# Patient Record
Sex: Female | Born: 1985 | Race: White | Hispanic: No | Marital: Married | Smoking: Never smoker
Health system: Southern US, Community
[De-identification: ages and names within clinical notes are randomized; demographics above are authoritative.]

## PROBLEM LIST (undated history)

## (undated) DIAGNOSIS — R42 Dizziness and giddiness: Secondary | ICD-10-CM

## (undated) HISTORY — PX: FOOT SURGERY: SHX648

---

## 2008-03-03 ENCOUNTER — Ambulatory Visit: Payer: Self-pay | Admitting: Family Medicine

## 2015-07-23 DIAGNOSIS — E669 Obesity, unspecified: Secondary | ICD-10-CM | POA: Insufficient documentation

## 2015-07-25 DIAGNOSIS — N83201 Unspecified ovarian cyst, right side: Secondary | ICD-10-CM | POA: Insufficient documentation

## 2017-05-29 ENCOUNTER — Encounter: Payer: Self-pay | Admitting: *Deleted

## 2017-05-29 ENCOUNTER — Ambulatory Visit
Admission: EM | Admit: 2017-05-29 | Discharge: 2017-05-29 | Disposition: A | Discharge status: Home / Self Care | Payer: 59 | Attending: Family Medicine | Admitting: Family Medicine

## 2017-05-29 DIAGNOSIS — N3001 Acute cystitis with hematuria: Secondary | ICD-10-CM

## 2017-05-29 LAB — URINALYSIS, COMPLETE (UACMP) WITH MICROSCOPIC
Bilirubin Urine: NEGATIVE
GLUCOSE, UA: NEGATIVE mg/dL
KETONES UR: NEGATIVE mg/dL
Nitrite: NEGATIVE
PROTEIN: 100 mg/dL — AB
SQUAMOUS EPITHELIAL / LPF: NONE SEEN
Specific Gravity, Urine: 1.01 (ref 1.005–1.030)
pH: 7 (ref 5.0–8.0)

## 2017-05-29 MED ORDER — AMOXICILLIN-POT CLAVULANATE 500-125 MG PO TABS: 1.0000 | ORAL_TABLET | Freq: Two times a day (BID) | ORAL | 0 refills | Status: AC

## 2017-05-29 NOTE — ED Provider Notes (Signed)
[facility]    CSN: [id number] Arrival date & time: 05/29/17  0810     History   Chief Complaint Chief Complaint  Patient presents with  . Urinary Frequency    HPI Carrie Benjamin is a 31 y.o. female.   CC: urinary frequency x since last night, unchanged.   Suprapubic pain when urinating. 'feels like prior UTIs.'  NO dysuria, hematuria, fever, flank pain.  However notes blood in pads since birth of daughter. Not increased since symptom started.   No recent UTI.  No concern for stds.    Breastfeeding; two week old daughter      History reviewed. No pertinent past medical history.  There are no active problems to display for this patient.   Past Surgical History:  Procedure Laterality Date  . FOOT SURGERY      OB History    No data available       Home Medications    Prior to Admission medications   Medication Sig Start Date End Date Taking? Authorizing Provider  Prenatal Vit-Fe Fumarate-FA (PRENATAL VITAMIN PO) Take by mouth daily.   Yes [provider]  amoxicillin-clavulanate (AUGMENTIN) 500-125 MG tablet Take 1 tablet (500 mg total) by mouth 2 (two) times daily. 05/29/17 06/03/17  Allegra GranaArnett, Jalea Bronaugh G, FNP    Family History History reviewed. No pertinent family history.  Social History Social History  Substance Use Topics  . Smoking status: Never Smoker  . Smokeless tobacco: Never Used  . Alcohol use Yes     Allergies   Sulfa antibiotics   Review of Systems Review of Systems  Constitutional: Negative for chills and fever.  Respiratory: Negative for cough.   Cardiovascular: Negative for chest pain and palpitations.  Gastrointestinal: Negative for nausea and vomiting.  Genitourinary: Positive for vaginal bleeding. Negative for dysuria, flank pain, hematuria, pelvic pain and vaginal discharge.     Physical Exam Triage Vital Signs ED Triage Vitals  Enc Vitals Group     BP 05/29/17 0830 131/85   Pulse Rate 05/29/17 0830 84     Resp 05/29/17 0830 16     Temp 05/29/17 0830 98.2 F (36.8 C)     Temp Source 05/29/17 0830 Oral     SpO2 05/29/17 0830 100 %     Weight 05/29/17 0831 232 lb (105.2 kg)     Height --      Head Circumference --      Peak Flow --      Pain Score 05/29/17 0832 2     Pain Loc --      Pain Edu? --      Excl. in GC? --    No data found.   Updated Vital Signs BP 131/85 (BP Location: Left Arm)   Pulse 84   Temp 98.2 F (36.8 C) (Oral)   Resp 16   Wt 232 lb (105.2 kg)   SpO2 100%   Visual Acuity Right Eye Distance:   Left Eye Distance:   Bilateral Distance:    Right Eye Near:   Left Eye Near:    Bilateral Near:     Physical Exam  Constitutional: She appears well-developed and well-nourished.  Cardiovascular: Normal rate, regular rhythm, normal heart sounds and normal pulses.   Pulmonary/Chest: Effort normal and breath sounds normal. She has no wheezes. She has no rhonchi. She has no rales.  Abdominal: There is no CVA tenderness.  Neurological: She is alert.  Skin: Skin is warm and dry.  Psychiatric: She has a normal mood and affect. Her speech is normal and behavior is normal. Thought content normal.  Vitals reviewed.    UC Treatments / Results  Labs (all labs ordered are listed, but only abnormal results are displayed) Labs Reviewed  URINALYSIS, COMPLETE (UACMP) WITH MICROSCOPIC - Abnormal; Notable for the following:       Result Value   APPearance HAZY (*)    Hgb urine dipstick LARGE (*)    Protein, ur 100 (*)    Leukocytes, UA MODERATE (*)    Bacteria, UA FEW (*)    All other components within normal limits  URINE CULTURE    EKG  EKG Interpretation None       Radiology No results found.  Procedures Procedures (including critical care time)  Medications Ordered in UC Medications - No data to display   Initial Impression / Assessment and Plan / UC Course  I have reviewed the triage vital signs and the nursing  notes.  Pertinent labs & imaging results that were available during my care of the patient were reviewed by me and considered in my medical decision making (see chart for details).       Final Clinical Impressions(s) / UC Diagnoses   Final diagnoses:  Acute cystitis with hematuria  Urine dip positive leukocytes, blood, few bacteria. Blood may be vaginal since patient is very recent postpartum. Patient is afebrile. She is currently breast-feeding;  we discussed starting augmetin  which is safe in breast-feeding. Pending urine culture. Return precautions given.  New Prescriptions Discharge Medication List as of 05/29/2017  8:47 AM    START taking these medications   Details  amoxicillin-clavulanate (AUGMENTIN) 500-125 MG tablet Take 1 tablet (500 mg total) by mouth 2 (two) times daily., Starting Sat 05/29/2017, Until Thu 06/03/2017, Normal         Controlled Substance Prescriptions [place] Controlled Substance Registry consulted? Not Applicable   Allegra Grana, [place] 05/29/17 [phone]

## 2017-05-29 NOTE — Discharge Instructions (Signed)
Plenty of fluids.  Will call with urine culture  Congrats on your daughter!

## 2017-05-29 NOTE — ED Triage Notes (Signed)
Patient started having urinary frequency and pain during urination last PM.

## 2017-06-01 LAB — URINE CULTURE

## 2017-09-02 ENCOUNTER — Ambulatory Visit
Admission: EM | Admit: 2017-09-02 | Discharge: 2017-09-02 | Disposition: A | Discharge status: Home / Self Care | Payer: 59 | Attending: Emergency Medicine | Admitting: Emergency Medicine

## 2017-09-02 ENCOUNTER — Other Ambulatory Visit: Payer: Self-pay

## 2017-09-02 DIAGNOSIS — Z3202 Encounter for pregnancy test, result negative: Secondary | ICD-10-CM

## 2017-09-02 DIAGNOSIS — R51 Headache: Secondary | ICD-10-CM

## 2017-09-02 DIAGNOSIS — J029 Acute pharyngitis, unspecified: Secondary | ICD-10-CM

## 2017-09-02 DIAGNOSIS — R509 Fever, unspecified: Secondary | ICD-10-CM | POA: Diagnosis not present

## 2017-09-02 LAB — RAPID STREP SCREEN (MED CTR MEBANE ONLY): Streptococcus, Group A Screen (Direct): NEGATIVE

## 2017-09-02 LAB — PREGNANCY, URINE: Preg Test, Ur: NEGATIVE

## 2017-09-02 MED ORDER — IBUPROFEN 600 MG PO TABS: 600.0000 mg | ORAL_TABLET | Freq: Four times a day (QID) | ORAL | 0 refills | Status: DC | PRN

## 2017-09-02 MED ORDER — PENICILLIN G BENZATHINE 1200000 UNIT/2ML IM SUSP
1.2000 10*6.[IU] | Freq: Once | INTRAMUSCULAR | Status: AC
  Administered 2017-09-02: 1.2 10*6.[IU] via INTRAMUSCULAR

## 2017-09-02 NOTE — Discharge Instructions (Signed)
your rapid strep was negative today, so we have sent off a throat culture.  We will contact you and call in the appropriate antibiotics if your culture comes back positive .  you were treated today for presumed strep throat so you do not need any further antibiotics.  Give us a working phone number.  1 gram of Tylenol and 600 mg ibuprofen together 3-4 times a day as needed for pain.  Make sure you drink plenty of extra fluids.  Some people find salt water gargles and  Traditional Medicinal's "Throat Coat" tea helpful. Take 5 mL of liquid Benadryl and 5 mL of Maalox. Mix it together, and then hold it in your mouth for as long as you can and then swallow. You may do this 4 times a day.  return here or follow-up with your doctor in 5-7 days for consideration of mono if you are not better.  Go to www.goodrx.com to look up your medications. This will give you a list of where you can find your prescriptions at the most affordable prices. Or ask the pharmacist what the cash price is, or if they have any other discount programs available to help make your medication more affordable. This can be less expensive than what you would pay with insurance.

## 2017-09-02 NOTE — ED Triage Notes (Signed)
Patient complains of sudden sore throat that started yesterday. Patient states that she started running a fever last night. Patient states that she is currently breastfeeding.

## 2017-09-02 NOTE — ED Provider Notes (Signed)
HPI  SUBJECTIVE:  Patient reports sore throat starting abruptly yesterday. Sx worse with swallowing.  Sx better with Tylenol + Fever tmax 104 tympanically + Swollen neck glands    No Cough/URI sxs + Myalgias + Headache No Rash     No Recent Strep, mono, flu exposure No Abdominal Pain No reflux sxs No Allergy sxs  No Breathing difficulty, voice changes, sensation of throat swelling shut No Drooling No Trismus No abx in past month.  Patient did get a flu shot this year + antipyretic in past 4-6 hrs-Tylenol Past medical history of recurrent strep in childhood, mono in college.  No history of diabetes, hypertension.  LMP: One year ago.  She is not sure if she could be pregnant.  She is currently breast-feeding. PMD: [facility] in [place].  History reviewed. No pertinent past medical history.  Past Surgical History:  Procedure Laterality Date  . FOOT SURGERY      History reviewed. No pertinent family history.  Social History   Tobacco Use  . Smoking status: Never Smoker  . Smokeless tobacco: Never Used  Substance Use Topics  . Alcohol use: Yes    Frequency: Never    Comment: occasionally  . Drug use: No    No current facility-administered medications for this encounter.   Current Outpatient Medications:  .  Prenatal Vit-Fe Fumarate-FA (PRENATAL VITAMIN PO), Take by mouth daily., Disp: , Rfl:  .  ibuprofen (ADVIL,MOTRIN) 600 MG tablet, Take 1 tablet (600 mg total) by mouth every 6 (six) hours as needed., Disp: 30 tablet, Rfl: 0  Allergies  Allergen Reactions  . Sulfa Antibiotics Rash     ROS  As noted in HPI.   Physical Exam  BP 118/87 (BP Location: Left Arm)   Pulse (!) 132   Temp 99.5 F (37.5 C) (Oral) Comment: tylenol taken at 315pm  Resp 17   Ht 5\' 8"  (1.727 m)   Wt 230 lb (104.3 kg)   SpO2 97%   BMI 34.97 kg/m   Constitutional: Well developed, well nourished, no acute distress Eyes:  EOMI, conjunctiva normal bilaterally HENT:  Normocephalic, atraumatic,mucus membranes moist.  Is normal.- nasal congestion +erythematous oropharynx + enlarged tonsils + exudates. Uvula midline.  Respiratory: Normal inspiratory effort Cardiovascular: , no murmurs, rubs, gallops GI: nondistended, Regular tachycardia, nontender. No appreciable splenomegaly skin: No rash, skin intact Lymph: + cervical LN  Musculoskeletal: no deformities Neurologic: Alert & oriented x 3, no focal neuro deficits Psychiatric: Speech and behavior appropriate.  ED Course   Medications  penicillin g benzathine (BICILLIN LA) 1200000 UNIT/2ML injection 1.2 Million Units (1.2 Million Units Intramuscular Given 09/02/17 1733)    Orders Placed This Encounter  Procedures  . Rapid strep screen    Standing Status:   Standing    Number of Occurrences:   1    Order Specific Question:   Patient immune status    Answer:   Normal  . Culture, group A strep    Standing Status:   Standing    Number of Occurrences:   1  . Pregnancy, urine    Standing Status:   Standing    Number of Occurrences:   1  . Droplet precaution    Standing Status:   Standing    Number of Occurrences:   1    Results for orders placed or performed during the hospital encounter of 09/02/17 (from the past 24 hour(s))  Rapid strep screen     Status: None   Collection  Time: 09/02/17  4:44 PM  Result Value Ref Range   Streptococcus, Group A Screen (Direct) NEGATIVE NEGATIVE  Pregnancy, urine     Status: None   Collection Time: 09/02/17  5:20 PM  Result Value Ref Range   Preg Test, Ur NEGATIVE NEGATIVE   No results found.  ED Clinical Impression  Exudative pharyngitis   ED Assessment/Plan .  Rapid strep negative.  Centor score is 4/4, so we will treat empirically for strep throat with Bicillin 1,200,000 units IM x1.  Home with continued pushing fluids, 600 mg of ibuprofen with 1 g of Tylenol, Benadryl/Maalox mixture. obtaining throat cx.   She will return here in 5-7 days for  consideration of mono if she is not better. . Patient to followup with PMD when necessary.  Urine pregnancy negative.    Discussed labs,  MDM, plan and followup with patient. Discussed sn/sx that should prompt return to the ED. patient agrees with plan.   Meds ordered this encounter  Medications  . penicillin g benzathine (BICILLIN LA) 1200000 UNIT/2ML injection 1.2 Million Units    Order Specific Question:   Antibiotic Indication:    Answer:   Pharyngitis  . ibuprofen (ADVIL,MOTRIN) 600 MG tablet    Sig: Take 1 tablet (600 mg total) by mouth every 6 (six) hours as needed.    Dispense:  30 tablet    Refill:  0     *This clinic note was created using Scientist, clinical (histocompatibility and immunogenetics)Dragon dictation software. Therefore, there may be occasional mistakes despite careful proofreading.    Domenick GongMortenson, Arturo Freundlich, MD 09/02/17 1758

## 2017-09-03 LAB — CULTURE, GROUP A STREP (THRC)

## 2018-03-08 ENCOUNTER — Ambulatory Visit
Admission: EM | Admit: 2018-03-08 | Discharge: 2018-03-08 | Disposition: A | Discharge status: Home / Self Care | Payer: 59 | Attending: Family Medicine | Admitting: Family Medicine

## 2018-03-08 ENCOUNTER — Other Ambulatory Visit: Payer: Self-pay

## 2018-03-08 DIAGNOSIS — R05 Cough: Secondary | ICD-10-CM

## 2018-03-08 DIAGNOSIS — R059 Cough, unspecified: Secondary | ICD-10-CM

## 2018-03-08 DIAGNOSIS — J0100 Acute maxillary sinusitis, unspecified: Secondary | ICD-10-CM | POA: Diagnosis not present

## 2018-03-08 MED ORDER — AMOXICILLIN-POT CLAVULANATE 875-125 MG PO TABS: 1.0000 | ORAL_TABLET | Freq: Two times a day (BID) | ORAL | 0 refills | Status: DC

## 2018-03-08 NOTE — ED Provider Notes (Signed)
[facility] ____________________________________________  Time seen: Approximately 10:21 AM  I have reviewed the triage vital signs and the nursing notes.   HISTORY  Chief Complaint Cough   HPI Carrie Benjamin is a 32 y.o. female presented for evaluation of runny nose, nasal congestion, sinus pressure and cough present for the last 3 weeks.  States initially her kids got sick but her husband and then she did.  States initially she was thought was a virus that was at around its course.  States around 2 weeks she thought that she might have been improving but worsened over the last week with increased sinus pain and pressure sensation.  Denies fevers.  States some chest congestion but denies any chest pain or shortness of breath.  Has continued to eat and drink well.  States only has occasionally taken some Tylenol.  She is currently breast-feeding her healthy 4460-month-old daughter so she was unsure what she could take over-the-counter.  Denies other aggravating or alleviating factors.  Reports otherwise feels well.  No sore throat currently.  Denies recent antibiotic use.  Denies chest pain or shortness of breath, extremity swelling, rash.  Denies concerns of current pregnancy.   History reviewed. No pertinent past medical history.  There are no active problems to display for this patient.   Past Surgical History:  Procedure Laterality Date  . FOOT SURGERY       No current facility-administered medications for this encounter.   Current Outpatient Medications:  .  Prenatal Vit-Fe Fumarate-FA (PRENATAL VITAMIN PO), Take by mouth daily., Disp: , Rfl:  .  amoxicillin-clavulanate (AUGMENTIN) 875-125 MG tablet, Take 1 tablet by mouth every 12 (twelve) hours., Disp: 20 tablet, Rfl: 0  Allergies Sulfa antibiotics  History reviewed. No pertinent family history.  Social History Social History   Tobacco Use  . Smoking status: Never Smoker  . Smokeless tobacco:  Never Used  Substance Use Topics  . Alcohol use: Yes    Frequency: Never    Comment: occasionally  . Drug use: No    Review of Systems Constitutional: No fever ENT: No sore throat. Cardiovascular: Denies chest pain. Respiratory: Denies shortness of breath. Gastrointestinal: No abdominal pain.  Musculoskeletal: Negative for back pain. Skin: Negative for rash. ____________________________________________   PHYSICAL EXAM:  VITAL SIGNS: ED Triage Vitals  Enc Vitals Group     BP 03/08/18 0942 (!) 140/99     Pulse Rate 03/08/18 0942 98     Resp 03/08/18 0942 18     Temp 03/08/18 0942 98 F (36.7 C)     Temp Source 03/08/18 0942 Oral     SpO2 03/08/18 0942 98 %     Weight 03/08/18 0941 220 lb (99.8 kg)     Height 03/08/18 0941 5\' 8"  (1.727 m)     Head Circumference --      Peak Flow --      Pain Score 03/08/18 0940 3     Pain Loc --      Pain Edu? --      Excl. in GC? --     Constitutional: Alert and oriented. Well appearing and in no acute distress. Eyes: Conjunctivae are normal.  Head: Atraumatic.Mild to moderate tenderness to palpation bilateral frontal and maxillary sinuses. No swelling. No erythema.   Ears: no erythema, normal TMs bilaterally.   Nose: nasal congestion with bilateral nasal turbinate erythema and edema.   Mouth/Throat: Mucous membranes are moist.  Oropharynx non-erythematous.No tonsillar swelling or exudate.  Neck: No  stridor.  No cervical spine tenderness to palpation. Hematological/Lymphatic/Immunilogical: No cervical lymphadenopathy. Cardiovascular: Normal rate, regular rhythm. Grossly normal heart sounds.  Good peripheral circulation. Respiratory: Normal respiratory effort.  No retractions. No wheezes, rales or rhonchi. Good air movement.  Musculoskeletal: No lower extremity tenderness nor edema.  No cervical, thoracic or lumbar tenderness to palpation.  Neurologic:  Normal speech and language. No gross focal neurologic deficits are appreciated. No  gait instability. Skin:  Skin is warm, dry and intact. No rash noted. Psychiatric: Mood and affect are normal. Speech and behavior are normal.  ___________________________________________   LABS (all labs ordered are listed, but only abnormal results are displayed)  Labs Reviewed - No data to display ____________________________________________   PROCEDURES Procedures    INITIAL IMPRESSION / ASSESSMENT AND PLAN / ED COURSE  Pertinent labs & imaging results that were available during my care of the patient were reviewed by me and considered in my medical decision making (see chart for details).  Well-appearing patient.  No acute distress.  Suspect sinusitis.  Will treat with oral Augmentin.  Currently nursing what she describes as a healthy 25-month-old without medical problems. Counseled regarding nursing with medications, discussed also over-the-counter guaifenesin and Claritin as needed.  Encourage rest, fluids, saline rinses and supportive care.Discussed indication, risks and benefits of medications with patient.  Discussed follow up with Primary care physician this week. Discussed follow up and return parameters including no resolution or any worsening concerns. Patient verbalized understanding and agreed to plan.   ____________________________________________   FINAL CLINICAL IMPRESSION(S) / ED DIAGNOSES  Final diagnoses:  Acute maxillary sinusitis, recurrence not specified  Cough     ED Discharge Orders        Ordered    amoxicillin-clavulanate (AUGMENTIN) 875-125 MG tablet  Every 12 hours     03/08/18 0959       Note: This dictation was prepared with Dragon dictation along with smaller phrase technology. Any transcriptional errors that result from this process are unintentional.         Renford Dills, NP 03/08/18 2032

## 2018-03-08 NOTE — ED Triage Notes (Signed)
Patient complains of cough, congestion, sinus pain and pressure x 3 weeks.

## 2018-03-08 NOTE — Discharge Instructions (Addendum)
Take medication as prescribed. Rest. Drink plenty of fluids.  ° °Follow up with your primary care physician this week as needed. Return to Urgent care for new or worsening concerns.  ° °

## 2018-03-11 ENCOUNTER — Ambulatory Visit
Admission: EM | Admit: 2018-03-11 | Discharge: 2018-03-11 | Disposition: A | Discharge status: Home / Self Care | Payer: 59 | Attending: Family Medicine | Admitting: Family Medicine

## 2018-03-11 ENCOUNTER — Encounter: Payer: Self-pay | Admitting: Emergency Medicine

## 2018-03-11 ENCOUNTER — Other Ambulatory Visit: Payer: Self-pay

## 2018-03-11 DIAGNOSIS — J320 Chronic maxillary sinusitis: Secondary | ICD-10-CM

## 2018-03-11 MED ORDER — HYDROCODONE-ACETAMINOPHEN 5-325 MG PO TABS: 1.0000 | ORAL_TABLET | Freq: Four times a day (QID) | ORAL | 0 refills | Status: DC | PRN

## 2018-03-11 MED ORDER — DOXYCYCLINE HYCLATE 100 MG PO CAPS: 100.0000 mg | ORAL_CAPSULE | Freq: Two times a day (BID) | ORAL | 0 refills | Status: DC

## 2018-03-11 MED ORDER — FLUTICASONE PROPIONATE 50 MCG/ACT NA SUSP: 2.0000 | Freq: Every day | NASAL | 0 refills | Status: DC

## 2018-03-11 NOTE — Discharge Instructions (Addendum)
If You are not improving follow up with ear nose and throat on Monday.  In the meantime ,if you are having more pain that is not improving, go to the emergency room.  Contact your pediatrician for approval of the use of doxycycline prior to taking it.

## 2018-03-11 NOTE — ED Triage Notes (Signed)
Patient c/o sinus pain and pressure x 3 weeks. Was seen on 07/30 for same symptoms and given Augmentin with no relief.

## 2018-03-11 NOTE — ED Provider Notes (Addendum)
[facility]    CSN: [id number] Arrival date & time: 03/11/18  1153     History   Chief Complaint No chief complaint on file.   HPI Carrie Benjamin is a 32 y.o. female.   HPI  32 year old female presents with right side facial pain and pressure that she has had for 3 weeks.  She was seen in this clinic on 03/08/2018 for the same symptoms.  She was given Augmentin; despite the medication has not gained any relief.  The reason for today's visit is that she has had extreme pain on the right side of her face states is constant.  She has brief periods where the pain  is  worse.  It affects only the right side of her face.  Not describe the pain as electrical or burning.  Nothing seems to make it worse.  She states that last night it was feeling better but today is progressively worsening as the day but is prolonged.  Is afebrile.  She has not been running a fever.  O2 sats are 96% on room air blood pressure is 149/105.  Breast-feeding a 5564-month-old child       History reviewed. No pertinent past medical history.  There are no active problems to display for this patient.   Past Surgical History:  Procedure Laterality Date  . FOOT SURGERY      OB History   None      Home Medications    Prior to Admission medications   Medication Sig Start Date End Date Taking? Authorizing Provider  Prenatal Vit-Fe Fumarate-FA (PRENATAL VITAMIN PO) Take by mouth daily.   Yes [provider]  doxycycline (VIBRAMYCIN) 100 MG capsule Take 1 capsule (100 mg total) by mouth 2 (two) times daily. 03/11/18   Lutricia Feiloemer, William P, PA-C  fluticasone (FLONASE) 50 MCG/ACT nasal spray Place 2 sprays into both nostrils daily. 03/11/18   Lutricia Feiloemer, William P, PA-C  HYDROcodone-acetaminophen (NORCO/VICODIN) 5-325 MG tablet Take 1-2 tablets by mouth every 6 (six) hours as needed. 03/11/18   Lutricia Feiloemer, William P, PA-C    Family History Family History  Problem Relation Age of Onset  . Healthy  Mother   . Healthy Father     Social History Social History   Tobacco Use  . Smoking status: Never Smoker  . Smokeless tobacco: Never Used  Substance Use Topics  . Alcohol use: Yes    Frequency: Never    Comment: occasionally  . Drug use: No     Allergies   Sulfa antibiotics   Review of Systems Review of Systems  Constitutional: Positive for activity change. Negative for appetite change, chills, fatigue and fever.  HENT: Positive for congestion, sinus pressure and sinus pain.   Respiratory: Negative for cough.   All other systems reviewed and are negative.    Physical Exam Triage Vital Signs ED Triage Vitals  Enc Vitals Group     BP 03/11/18 1212 (!) 149/105     Pulse Rate 03/11/18 1212 85     Resp 03/11/18 1212 16     Temp 03/11/18 1212 97.9 F (36.6 C)     Temp Source 03/11/18 1212 Oral     SpO2 03/11/18 1212 96 %     Weight 03/11/18 1208 220 lb (99.8 kg)     Height 03/11/18 1208 5\' 8"  (1.727 m)     Head Circumference --      Peak Flow --      Pain Score 03/11/18 1208  7     Pain Loc --      Pain Edu? --      Excl. in GC? --    No data found.  Updated Vital Signs BP (!) 149/105 (BP Location: Right Arm)   Pulse 85   Temp 97.9 F (36.6 C) (Oral)   Resp 16   Ht 5\' 8"  (1.727 m)   Wt 220 lb (99.8 kg)   SpO2 96%   BMI 33.45 kg/m   Visual Acuity Right Eye Distance:   Left Eye Distance:   Bilateral Distance:    Right Eye Near:   Left Eye Near:    Bilateral Near:     Physical Exam  Constitutional: She is oriented to person, place, and time. She appears well-developed and well-nourished. No distress.  HENT:  Head: Normocephalic.  Right Ear: External ear normal.  Left Ear: External ear normal.  Nose: Nose normal.  Mouth/Throat: Oropharynx is clear and moist. No oropharyngeal exudate.  She has tenderness to percussion on the right side of her maxillary sinus more than left.  Eyes: Pupils are equal, round, and reactive to light. Right eye  exhibits no discharge. Left eye exhibits no discharge.  Neck: Normal range of motion. Neck supple.  Pulmonary/Chest: Effort normal and breath sounds normal.  Musculoskeletal: Normal range of motion.  Lymphadenopathy:    She has no cervical adenopathy.  Neurological: She is alert and oriented to person, place, and time. She displays normal reflexes. No cranial nerve deficit or sensory deficit. She exhibits normal muscle tone. Coordination normal.  Skin: Skin is warm and dry. She is not diaphoretic.  Psychiatric: She has a normal mood and affect. Her behavior is normal. Judgment and thought content normal.  Nursing note and vitals reviewed.    UC Treatments / Results  Labs (all labs ordered are listed, but only abnormal results are displayed) Labs Reviewed - No data to display  EKG None  Radiology No results found.  Procedures Procedures (including critical care time)  Medications Ordered in UC Medications - No data to display  Initial Impression / Assessment and Plan / UC Course  I have reviewed the triage vital signs and the nursing notes.  Pertinent labs & imaging results that were available during my care of the patient were reviewed by me and considered in my medical decision making (see chart for details).     Plan: 1. Test/x-ray results and diagnosis reviewed with patient 2. rx as per orders; risks, benefits, potential side effects reviewed with patient 3. Recommend supportive treatment with Flonase nasal spray 2 sprays once daily for next 2 to 3 weeks.  I will switch her antibiotic from Augmentin to  doxycycline for short course to see if it is a failure of the antibiotic to cover her sinusitis.  Told Her that is very unusual for her to have the amount of pain that she has been, already being on the Augmentin and I am concerned about this.  Told her that if she is not improving after 2 doses of the doxycycline and the pain persists or worsen she should go to the  emergency room.  Also recommended that on Monday she should be seen by an ear nose and throat specialist if she is not improving.  She is afebrile but this may be because of the antipyretic that she is taking and also on Augmentin at the present time.  According to [employer] site,doxycycline is okay to use during breast-feeding with a very  short course.  We will also prescribe Vicodin because the amount of pain that she is having. 4.  To the emergency room if symptoms worsen or don't improve  Final Clinical Impressions(s) / UC Diagnoses   Final diagnoses:  Chronic maxillary sinusitis     Discharge Instructions      If You are not improving follow up with ear nose and throat on Monday.  In the meantime ,if you are having more pain that is not improving, go to the emergency room.  Contact your pediatrician for approval of the use of doxycycline prior to taking it.   ED Prescriptions    Medication Sig Dispense Auth. Provider   HYDROcodone-acetaminophen (NORCO/VICODIN) 5-325 MG tablet Take 1-2 tablets by mouth every 6 (six) hours as needed. 10 tablet Ovid Curd P, PA-C   doxycycline (VIBRAMYCIN) 100 MG capsule Take 1 capsule (100 mg total) by mouth 2 (two) times daily. 20 capsule Ovid Curd P, PA-C   fluticasone (FLONASE) 50 MCG/ACT nasal spray Place 2 sprays into both nostrils daily. 16 g Lutricia Feil, PA-C     Controlled Substance Prescriptions [place] Controlled Substance Registry consulted?  No records of use of narcotics listed      Joesph July 03/11/18 2026     At my request nurses contacted the patient this evening at 8:29 PM.  States that she was feeling much better and had much less pain.  Contacted the pediatrician who agreed that doxycycline was okay to take for the sinusitis   Lutricia Feil, PA-C 03/11/18 2030

## 2019-02-10 ENCOUNTER — Other Ambulatory Visit: Payer: Self-pay

## 2019-02-10 ENCOUNTER — Telehealth: Payer: Self-pay | Admitting: Family Medicine

## 2019-02-10 ENCOUNTER — Ambulatory Visit
Admission: EM | Admit: 2019-02-10 | Discharge: 2019-02-10 | Disposition: A | Discharge status: Home / Self Care | Payer: 59 | Attending: Family Medicine | Admitting: Family Medicine

## 2019-02-10 ENCOUNTER — Encounter: Payer: Self-pay | Admitting: Emergency Medicine

## 2019-02-10 DIAGNOSIS — Z3202 Encounter for pregnancy test, result negative: Secondary | ICD-10-CM

## 2019-02-10 DIAGNOSIS — R102 Pelvic and perineal pain: Secondary | ICD-10-CM | POA: Diagnosis not present

## 2019-02-10 DIAGNOSIS — R1032 Left lower quadrant pain: Secondary | ICD-10-CM | POA: Diagnosis not present

## 2019-02-10 LAB — URINALYSIS, COMPLETE (UACMP) WITH MICROSCOPIC
Bilirubin Urine: NEGATIVE
Glucose, UA: NEGATIVE mg/dL
Hgb urine dipstick: NEGATIVE
Ketones, ur: NEGATIVE mg/dL
Leukocytes,Ua: NEGATIVE
Nitrite: NEGATIVE
Protein, ur: NEGATIVE mg/dL
RBC / HPF: NONE SEEN RBC/hpf (ref 0–5)
Specific Gravity, Urine: 1.02 (ref 1.005–1.030)
pH: 7 (ref 5.0–8.0)

## 2019-02-10 LAB — PREGNANCY, URINE: Preg Test, Ur: NEGATIVE

## 2019-02-10 NOTE — ED Provider Notes (Signed)
[facility]    CSN: [id number] Arrival date & time: 02/10/19  1026     History   Chief Complaint Chief Complaint  Patient presents with  . Abdominal Pain    LLQ    HPI Carrie Wolfgang PhoenixJean Kimrey Benjamin is a 33 y.o. female.   [age] yo female with a c/o left lower abdominal/pelvic pains intermittently for the past 3 days. Denies any fevers, chills, nausea, vomiting, diarrhea, constipation, flank pain, hematuria, vaginal discharge, pregnancy. States she's due to have her menstrual period next week. Has not taken medications for this.   States about 2 years ago she had a colonoscopy and it was negative for diverticulosis.      History reviewed. No pertinent past medical history.  There are no active problems to display for this patient.   Past Surgical History:  Procedure Laterality Date  . FOOT SURGERY      OB History   No obstetric history on file.      Home Medications    Prior to Admission medications   Medication Sig Start Date End Date Taking? Authorizing Provider  doxycycline (VIBRAMYCIN) 100 MG capsule Take 1 capsule (100 mg total) by mouth 2 (two) times daily. 03/11/18   Lutricia Feiloemer, William P, PA-C  fluticasone (FLONASE) 50 MCG/ACT nasal spray Place 2 sprays into both nostrils daily. 03/11/18   Lutricia Feiloemer, William P, PA-C  HYDROcodone-acetaminophen (NORCO/VICODIN) 5-325 MG tablet Take 1-2 tablets by mouth every 6 (six) hours as needed. 03/11/18   Lutricia Feiloemer, William P, PA-C  Prenatal Vit-Fe Fumarate-FA (PRENATAL VITAMIN PO) Take by mouth daily.    [provider]    Family History Family History  Problem Relation Age of Onset  . Healthy Mother   . Healthy Father     Social History Social History   Tobacco Use  . Smoking status: Never Smoker  . Smokeless tobacco: Never Used  Substance Use Topics  . Alcohol use: Yes    Frequency: Never    Comment: occasionally  . Drug use: No     Allergies   Sulfa antibiotics   Review of Systems Review of  Systems   Physical Exam Triage Vital Signs ED Triage Vitals  Enc Vitals Group     BP 02/10/19 1041 106/68     Pulse Rate 02/10/19 1041 91     Resp 02/10/19 1041 16     Temp 02/10/19 1041 98.2 F (36.8 C)     Temp Source 02/10/19 1041 Oral     SpO2 02/10/19 1041 98 %     Weight 02/10/19 1038 240 lb (108.9 kg)     Height 02/10/19 1038 5\' 8"  (1.727 m)     Head Circumference --      Peak Flow --      Pain Score 02/10/19 1037 2     Pain Loc --      Pain Edu? --      Excl. in GC? --    No data found.  Updated Vital Signs BP 106/68 (BP Location: Left Arm)   Pulse 91   Temp 98.2 F (36.8 C) (Oral)   Resp 16   Ht 5\' 8"  (1.727 m)   Wt 108.9 kg   LMP 01/16/2019 (Approximate)   SpO2 98%   BMI 36.49 kg/m   Visual Acuity Right Eye Distance:   Left Eye Distance:   Bilateral Distance:    Right Eye Near:   Left Eye Near:    Bilateral Near:     Physical  Exam Vitals signs and nursing note reviewed.  Constitutional:      General: She is not in acute distress.    Appearance: She is not toxic-appearing or diaphoretic.  Pulmonary:     Effort: Pulmonary effort is normal.  Abdominal:     General: Bowel sounds are normal. There is no distension.     Palpations: Abdomen is soft. There is no mass.     Tenderness: There is abdominal tenderness (left lower quadrant (left pelvis area); no rebound or guarding). There is no right CVA tenderness, left CVA tenderness, guarding or rebound.     Hernia: No hernia is present.  Neurological:     Mental Status: She is alert.      UC Treatments / Results  Labs (all labs ordered are listed, but only abnormal results are displayed) Labs Reviewed  URINALYSIS, COMPLETE (UACMP) WITH MICROSCOPIC - Abnormal; Notable for the following components:      Result Value   Bacteria, UA FEW (*)    All other components within normal limits  PREGNANCY, URINE    EKG   Radiology No results found.  Procedures Procedures (including critical care  time)  Medications Ordered in UC Medications - No data to display  Initial Impression / Assessment and Plan / UC Course  I have reviewed the triage vital signs and the nursing notes.  Pertinent labs & imaging results that were available during my care of the patient were reviewed by me and considered in my medical decision making (see chart for details).      Final Clinical Impressions(s) / UC Diagnoses   Final diagnoses:  Abdominal pain, left lower quadrant  Pelvic pain     Discharge Instructions     Ibuprofen 600mg  three times daily Increase water intake Follow up as needed if symptoms worsen    ED Prescriptions    None      1. Lab results and diagnosis reviewed with patient 2. Recommend supportive treatment as above 3. Follow-up prn if symptoms worsen or don't improve   Controlled Substance Prescriptions [place] Controlled Substance Registry consulted? Not Applicable   Norval Gable, MD 02/10/19 [phone]

## 2019-02-10 NOTE — ED Triage Notes (Signed)
Patient c/o LLQ abdominal pain off and on since Tuesday.  Patient states that she has her yearly appointment with PCP next week.   Patient reports pain in her abdomen when she urinates.  Patient denies fevers.  Patient denies N/V/D.

## 2019-02-10 NOTE — Discharge Instructions (Signed)
Ibuprofen 600mg  three times daily Increase water intake Follow up as needed if symptoms worsen

## 2019-02-12 LAB — URINE CULTURE

## 2019-02-14 NOTE — Telephone Encounter (Signed)
Opened encounter by mistake.

## 2019-05-23 ENCOUNTER — Encounter: Payer: Self-pay | Admitting: Emergency Medicine

## 2019-05-23 ENCOUNTER — Ambulatory Visit
Admission: EM | Admit: 2019-05-23 | Discharge: 2019-05-23 | Disposition: A | Discharge status: Home / Self Care | Payer: 59 | Attending: Family Medicine | Admitting: Family Medicine

## 2019-05-23 ENCOUNTER — Other Ambulatory Visit: Payer: Self-pay

## 2019-05-23 DIAGNOSIS — N390 Urinary tract infection, site not specified: Secondary | ICD-10-CM

## 2019-05-23 LAB — URINALYSIS, COMPLETE (UACMP) WITH MICROSCOPIC
Bacteria, UA: NONE SEEN
Bilirubin Urine: NEGATIVE
Glucose, UA: NEGATIVE mg/dL
Ketones, ur: NEGATIVE mg/dL
Nitrite: NEGATIVE
Protein, ur: 100 mg/dL — AB
RBC / HPF: >50 RBC/hpf (ref 0–5)
Specific Gravity, Urine: 1.02 (ref 1.005–1.030)
pH: 7.5 (ref 5.0–8.0)

## 2019-05-23 MED ORDER — NITROFURANTOIN MONOHYD MACRO 100 MG PO CAPS: 100.0000 mg | ORAL_CAPSULE | Freq: Two times a day (BID) | ORAL | 0 refills | Status: DC

## 2019-05-23 NOTE — ED Triage Notes (Signed)
Pt c/o urinary frequency, lower abdominal pain, and hematuria. Started yesterday but worse this morning.

## 2019-05-23 NOTE — ED Provider Notes (Signed)
[place] [place]   Name: Kemara Quigley DOB: 1986-05-22 MRN: [id number] CSN: [id number] PCP: [facility]  Arrival date and time:  05/23/19 0865  Chief Complaint:  Urinary Frequency and Abdominal Pain   NOTE: Prior to seeing the patient today, I have reviewed the triage nursing documentation and vital signs. Clinical staff has updated patient's PMH/PSHx, current medication list, and drug allergies/intolerances to ensure comprehensive history available to assist in medical decision making.   History:   HPI: Carrie Benjamin is a 33 y.o. female who presents today with complaints of urinary symptoms that began with acute onset yesterday. She complains of dysuria, frequency, and urgency. She has appreciated gross hematuria. Patient denies her urine being malodorous. Patient denies any associated nausea, vomiting, fever, and chills. She has not experienced any pain in her lower back or flank area, but has has pressure/fullness in the suprapubic area of her abdomen. Patient advises that she does not have a past medical history that is significant for recurrent urinary tract infections. She denies any vaginal pain, bleeding, or discharge. Patient's last menstrual period was 05/09/2019. There are no concerns that she is currently pregnant.   History reviewed. No pertinent past medical history.  Past Surgical History:  Procedure Laterality Date  . FOOT SURGERY      Family History  Problem Relation Age of Onset  . Healthy Mother   . Healthy Father     Social History   Tobacco Use  . Smoking status: Never Smoker  . Smokeless tobacco: Never Used  Substance Use Topics  . Alcohol use: Yes    Frequency: Never    Comment: occasionally  . Drug use: No    There are no active problems to display for this patient.   Home Medications:    Current Meds  Medication Sig  . DULoxetine (CYMBALTA) 20 MG capsule Take by mouth.    Allergies:   Sulfa  antibiotics  Review of Systems (ROS): Review of Systems  Constitutional: Negative for chills and fever.  Respiratory: Negative for cough and shortness of breath.   Cardiovascular: Negative for chest pain and palpitations.  Gastrointestinal: Positive for abdominal pain (suprapubic). Negative for nausea and vomiting.  Genitourinary: Positive for dysuria, frequency, hematuria and urgency. Negative for decreased urine volume, pelvic pain, vaginal bleeding, vaginal discharge and vaginal pain.  Musculoskeletal: Negative for back pain.  Skin: Negative for color change, pallor and rash.  Neurological: Negative for dizziness, syncope, weakness and headaches.  All other systems reviewed and are negative.    Vital Signs: Today's Vitals   05/23/19 0856 05/23/19 0857 05/23/19 0859  BP:   (!) 131/100  Pulse:   90  Resp:   18  Temp:   98.2 F (36.8 C)  TempSrc:   Oral  SpO2:   96%  Weight:  235 lb (106.6 kg)   Height:  5\' 8"  (1.727 m)   PainSc: 4       Physical Exam: Physical Exam  Constitutional: She is oriented to person, place, and time and well-developed, well-nourished, and in no distress. No distress.  HENT:  Head: Normocephalic and atraumatic.  Eyes: Pupils are equal, round, and reactive to light. EOM are normal.  Neck: Normal range of motion. Neck supple.  Cardiovascular: Normal rate.  Pulmonary/Chest: Effort normal. No respiratory distress. She has no decreased breath sounds. She has no rhonchi.  Abdominal: Soft. Normal appearance and bowel sounds are normal. She exhibits no distension. There is no abdominal tenderness.  There is no CVA tenderness.  Musculoskeletal: Normal range of motion.  Neurological: She is alert and oriented to person, place, and time. Gait normal.  Skin: Skin is warm and dry. No rash noted. She is not diaphoretic.  Psychiatric: Mood, memory, affect and judgment normal.  Nursing note and vitals reviewed.   Urgent Care Treatments / Results:   LABS:  PLEASE NOTE: all labs that were ordered this encounter are listed, however only abnormal results are displayed. Labs Reviewed  URINALYSIS, COMPLETE (UACMP) WITH MICROSCOPIC - Abnormal; Notable for the following components:      Result Value   Color, Urine PINK (*)    APPearance HAZY (*)    Hgb urine dipstick LARGE (*)    Protein, ur 100 (*)    Leukocytes,Ua MODERATE (*)    All other components within normal limits  URINE CULTURE    EKG: -None  RADIOLOGY: No results found.  PROCEDURES: Procedures  MEDICATIONS RECEIVED THIS VISIT: Medications - No data to display  PERTINENT CLINICAL COURSE NOTES/UPDATES:   Initial Impression / Assessment and Plan / Urgent Care Course:  Pertinent labs & imaging results that were available during my care of the patient were personally reviewed by me and considered in my medical decision making (see lab/imaging section of note for values and interpretations).  Luticia Lyndsie Wallman is a 33 y.o. female who presents to [facility] today with complaints of Urinary Frequency and Abdominal Pain   Patient is well appearing overall in clinic today. She does not appear to be in any acute distress. Presenting symptoms (see HPI) and exam as documented above. UA was (+) for infection; reflex culture sent. Will treat with a 5 day course of nitrofurantoin. Patient encouraged to complete the entire course of antibiotics even if she begins to feel better. She was advised that if culture demonstrates resistance to the prescribed antibiotic, she will be contacted and advised of the need to change the antibiotic being used to treat her infection. Patient encouraged to increase her fluid intake as much as possible. Discussed that water is always best to flush the urinary tract. She was advised to avoid caffeine containing fluids until her infections clears, as caffeine can cause her to experience painful bladder spasms. May use Tylenol and/or Ibuprofen as  needed for pain/fever. May also use over the counter phenazopyridine to help relieve her current urinary pain.   Discussed follow up with primary care physician in 1 week for re-evaluation. I have reviewed the follow up and strict return precautions for any new or worsening symptoms. Patient is aware of symptoms that would be deemed urgent/emergent, and would thus require further evaluation either here or in the emergency department. At the time of discharge, she verbalized understanding and consent with the discharge plan as it was reviewed with her. All questions were fielded by provider and/or clinic staff prior to patient discharge.    Final Clinical Impressions / Urgent Care Diagnoses:   Final diagnoses:  Urinary tract infection without hematuria, site unspecified    New Prescriptions:  [place] Controlled Substance Registry consulted? Not Applicable  Meds ordered this encounter  Medications  . nitrofurantoin, macrocrystal-monohydrate, (MACROBID) 100 MG capsule    Sig: Take 1 capsule (100 mg total) by mouth 2 (two) times daily.    Dispense:  10 capsule    Refill:  0    Recommended Follow up Care:  Patient encouraged to follow up with the following provider within the specified time frame, or sooner  as dictated by the severity of her symptoms. As always, she was instructed that for any urgent/emergent care needs, she should seek care either here or in the emergency department for more immediate evaluation.  Follow-up Information    [place] In 1 week.   Why: General reassessment of symptoms if not improving Contact information: [address] [place] [address] [place] [id number] [phone]         NOTE: This note was prepared using Dragon dictation software along with smaller phrase technology. Despite my best ability to proofread, there is the potential that transcriptional errors may still occur from this process, and are completely unintentional.    Verlee MonteGray, Staisha Winiarski E, NP  05/23/19 [phone]

## 2019-05-23 NOTE — Discharge Instructions (Signed)
It was very nice seeing you today in clinic. Thank you for entrusting me with your care.   As discussed, your urine is POSITIVE for infection. Will approach treatment as follows:  Prescription has been sent to your pharmacy for antibiotics.  Please pick up and take as directed. FINISH the entire course of medication even if you are feeling better.  A culture will be sent on your provided sample. If it comes back resistant to what I have prescribed you, someone will call you and let you know that we will need to change antibiotics. Increase fluid intake as much as possible to flush your urinary tract.  Water is always the best.  Avoid caffeine until your infection clears up, as it can contribute to painful bladder spasms.  May use Tylenol and/or Ibuprofen as needed for pain/fever. May use Azo products (over the counter) to help with pain/discomfort.   Make arrangements to follow up with your regular doctor in 1 week for re-evaluation. If your symptoms/condition worsens, please seek follow up care either here or in the ER. Please remember, our [facility] providers are "right here with you" when you need us.   Again, it was my pleasure to take care of you today. Thank you for choosing our clinic. I hope that you start to feel better quickly.   Emon Lance, MSN, APRN, FNP-C, CEN Advanced Practice Provider  [facility] 

## 2019-05-24 LAB — URINE CULTURE

## 2019-08-22 ENCOUNTER — Encounter: Payer: Self-pay | Admitting: Emergency Medicine

## 2019-08-22 ENCOUNTER — Ambulatory Visit
Admission: EM | Admit: 2019-08-22 | Discharge: 2019-08-22 | Disposition: A | Discharge status: Home / Self Care | Payer: 59 | Attending: Family Medicine | Admitting: Family Medicine

## 2019-08-22 ENCOUNTER — Other Ambulatory Visit: Payer: Self-pay

## 2019-08-22 DIAGNOSIS — H8110 Benign paroxysmal vertigo, unspecified ear: Secondary | ICD-10-CM | POA: Diagnosis not present

## 2019-08-22 HISTORY — DX: Dizziness and giddiness: R42

## 2019-08-22 NOTE — ED Provider Notes (Signed)
[facility]    CSN: [id number] Arrival date & time: 08/22/19  1554      History   Chief Complaint Chief Complaint  Patient presents with  . Dizziness    HPI Carrie Benjamin is a 34 y.o. female.   34 yo female with a c/o dizziness since this morning. States sensation is "room spinning" similar to previous vertigo episodes in the past. Denies any fevers, chills, vision changes, numbness/tingling, unilateral weakness.      Past Medical History:  Diagnosis Date  . Vertigo     There are no problems to display for this patient.   Past Surgical History:  Procedure Laterality Date  . FOOT SURGERY      OB History   No obstetric history on file.      Home Medications    Prior to Admission medications   Medication Sig Start Date End Date Taking? Authorizing Provider  DULoxetine (CYMBALTA) 20 MG capsule Take by mouth. 03/17/19 03/16/20 Yes [provider]  nitrofurantoin, macrocrystal-monohydrate, (MACROBID) 100 MG capsule Take 1 capsule (100 mg total) by mouth 2 (two) times daily. 05/23/19   Karen Kitchens, NP  fluticasone (FLONASE) 50 MCG/ACT nasal spray Place 2 sprays into both nostrils daily. 03/11/18 05/23/19  Lorin Picket, PA-C    Family History Family History  Problem Relation Age of Onset  . Healthy Mother   . Healthy Father     Social History Social History   Tobacco Use  . Smoking status: Never Smoker  . Smokeless tobacco: Never Used  Substance Use Topics  . Alcohol use: Yes    Comment: occasionally  . Drug use: No     Allergies   Sulfa antibiotics   Review of Systems Review of Systems   Physical Exam Triage Vital Signs ED Triage Vitals  Enc Vitals Group     BP 08/22/19 1627 (!) 157/115     Pulse Rate 08/22/19 1627 93     Resp 08/22/19 1627 18     Temp 08/22/19 1627 98.2 F (36.8 C)     Temp Source 08/22/19 1627 Oral     SpO2 08/22/19 1627 100 %     Weight 08/22/19 1623 230 lb (104.3 kg)     Height  08/22/19 1623 5\' 8"  (1.727 m)     Head Circumference --      Peak Flow --      Pain Score 08/22/19 1623 0     Pain Loc --      Pain Edu? --      Excl. in [place]? --    No data found.  Updated Vital Signs BP (!) 157/115 (BP Location: Left Arm)   Pulse 93   Temp 98.2 F (36.8 C) (Oral)   Resp 18   Ht 5\' 8"  (1.727 m)   Wt 104.3 kg   LMP 08/01/2019 (Approximate)   SpO2 100%   BMI 34.97 kg/m   Visual Acuity Right Eye Distance:   Left Eye Distance:   Bilateral Distance:    Right Eye Near:   Left Eye Near:    Bilateral Near:     Physical Exam Vitals and nursing note reviewed.  Constitutional:      General: She is not in acute distress.    Appearance: She is not toxic-appearing or diaphoretic.  Cardiovascular:     Rate and Rhythm: Normal rate.  Pulmonary:     Effort: Pulmonary effort is normal. No respiratory distress.  Neurological:  General: No focal deficit present.     Mental Status: She is alert and oriented to person, place, and time.     Comments: Positive Hallpike maneuver      UC Treatments / Results  Labs (all labs ordered are listed, but only abnormal results are displayed) Labs Reviewed - No data to display  EKG   Radiology No results found.  Procedures Procedures (including critical care time)  Medications Ordered in UC Medications - No data to display  Initial Impression / Assessment and Plan / UC Course  I have reviewed the triage vital signs and the nursing notes.  Pertinent labs & imaging results that were available during my care of the patient were reviewed by me and considered in my medical decision making (see chart for details).      Final Clinical Impressions(s) / UC Diagnoses   Final diagnoses:  Benign paroxysmal positional vertigo, unspecified laterality     Discharge Instructions     Over the counter Dramamine or meclizine    ED Prescriptions    None      1.diagnosis reviewed with patient 2. Recommend  supportive treatment as above and vestibular exercises (modified Epley's) 3. Follow-up prn if symptoms worsen or don't improve   PDMP not reviewed this encounter.   Payton Mccallum, MD 08/22/19 2029

## 2019-08-22 NOTE — Discharge Instructions (Signed)
Over the counter Dramamine or meclizine

## 2019-08-22 NOTE — ED Triage Notes (Addendum)
Pt c/o dizziness. Started this morning about 10 am. She states that she has had vertigo in the past and this feel similar. She states is she turns her head or looks up, the room started spinning.

## 2019-09-27 ENCOUNTER — Other Ambulatory Visit: Payer: Self-pay

## 2019-09-27 ENCOUNTER — Ambulatory Visit
Admission: EM | Admit: 2019-09-27 | Discharge: 2019-09-27 | Disposition: A | Discharge status: Home / Self Care | Payer: 59 | Attending: Family Medicine | Admitting: Family Medicine

## 2019-09-27 DIAGNOSIS — Z20822 Contact with and (suspected) exposure to covid-19: Secondary | ICD-10-CM | POA: Insufficient documentation

## 2019-09-27 DIAGNOSIS — Z79899 Other long term (current) drug therapy: Secondary | ICD-10-CM | POA: Insufficient documentation

## 2019-09-27 DIAGNOSIS — J019 Acute sinusitis, unspecified: Secondary | ICD-10-CM

## 2019-09-27 DIAGNOSIS — Z882 Allergy status to sulfonamides status: Secondary | ICD-10-CM | POA: Insufficient documentation

## 2019-09-27 LAB — SARS CORONAVIRUS 2 AG (30 MIN TAT): SARS Coronavirus 2 Ag: NEGATIVE

## 2019-09-27 MED ORDER — DOXYCYCLINE HYCLATE 100 MG PO CAPS: 100.0000 mg | ORAL_CAPSULE | Freq: Two times a day (BID) | ORAL | 0 refills | Status: DC

## 2019-09-27 MED ORDER — FLUTICASONE PROPIONATE 50 MCG/ACT NA SUSP: 1.0000 | Freq: Every day | NASAL | 2 refills | Status: DC

## 2019-09-27 NOTE — ED Provider Notes (Signed)
[facility]    CSN: [id number] Arrival date & time: 09/27/19  0844      History   Chief Complaint Chief Complaint  Patient presents with  . Sinus Problem    HPI Carrie Benjamin is a 34 y.o. female.   Carrie Benjamin presents with complaints of sinus symptoms and ear pain. Woke yesterday with congestion, "head stuffy" and runny nose. Last night developed more sinus pain, woke up due to pain. History of sinus infections in the past which felt like a "cold" with "pressure."  Feels similar to a sinus infection she has had in the past which had gotten very bad for her. Left side of her face, left teeth are painful, behind left eye, left ear aches, left nose congestion. No headache, no sore throat. No fevers. No cough. No chest pain  No shortness of breath . No sore throat. No gi symptoms. No known ill contacts. Took ibuprofen last night which did provide some relief.    ROS per HPI, negative if not otherwise mentioned.      Past Medical History:  Diagnosis Date  . Vertigo     There are no problems to display for this patient.   Past Surgical History:  Procedure Laterality Date  . FOOT SURGERY      OB History   No obstetric history on file.      Home Medications    Prior to Admission medications   Medication Sig Start Date End Date Taking? Authorizing Provider  DULoxetine (CYMBALTA) 20 MG capsule Take by mouth. 03/17/19 03/16/20 Yes [provider]  doxycycline (VIBRAMYCIN) 100 MG capsule Take 1 capsule (100 mg total) by mouth 2 (two) times daily. 09/30/19   Georgetta Haber, NP  fluticasone (FLONASE) 50 MCG/ACT nasal spray Place 1 spray into both nostrils daily. 09/27/19   Georgetta Haber, NP    Family History Family History  Problem Relation Age of Onset  . Healthy Mother   . Healthy Father     Social History Social History   Tobacco Use  . Smoking status: Never Smoker  . Smokeless tobacco: Never Used  Substance  Use Topics  . Alcohol use: Yes    Comment: occasionally  . Drug use: No     Allergies   Sulfa antibiotics   Review of Systems Review of Systems   Physical Exam Triage Vital Signs ED Triage Vitals  Enc Vitals Group     BP 09/27/19 0855 (!) 138/106     Pulse Rate 09/27/19 0855 (!) 111     Resp 09/27/19 0855 18     Temp 09/27/19 0855 98.3 F (36.8 C)     Temp Source 09/27/19 0855 Oral     SpO2 09/27/19 0855 100 %     Weight 09/27/19 0853 230 lb (104.3 kg)     Height 09/27/19 0853 5\' 8"  (1.727 m)     Head Circumference --      Peak Flow --      Pain Score 09/27/19 0853 4     Pain Loc --      Pain Edu? --      Excl. in [place]? --    No data found.  Updated Vital Signs BP (!) 138/106 (BP Location: Left Arm)   Pulse (!) 111   Temp 98.3 F (36.8 C) (Oral)   Resp 18   Ht 5\' 8"  (1.727 m)   Wt 230 lb (104.3 kg)   LMP 09/17/2019  SpO2 100%   BMI 34.97 kg/m    Physical Exam Constitutional:      General: She is not in acute distress.    Appearance: She is well-developed.  HENT:     Head:     Jaw: There is normal jaw occlusion.     Right Ear: Tympanic membrane, ear canal and external ear normal.     Left Ear: Tympanic membrane, ear canal and external ear normal.     Nose: Nose normal.     Right Sinus: No maxillary sinus tenderness or frontal sinus tenderness.     Left Sinus: No maxillary sinus tenderness or frontal sinus tenderness.     Mouth/Throat:     Mouth: Mucous membranes are moist.     Pharynx: Oropharynx is clear.     Tonsils: No tonsillar exudate or tonsillar abscesses.  Cardiovascular:     Rate and Rhythm: Tachycardia present.  Pulmonary:     Effort: Pulmonary effort is normal.  Musculoskeletal:     Cervical back: Normal range of motion.  Skin:    General: Skin is warm and dry.  Neurological:     Mental Status: She is alert and oriented to person, place, and time.      UC Treatments / Results  Labs (all labs ordered are listed, but only  abnormal results are displayed) Labs Reviewed  SARS CORONAVIRUS 2 AG (30 MIN TAT)  NOVEL CORONAVIRUS, NAA (HOSP ORDER, SEND-OUT TO REF LAB; TAT 18-24 HRS)    EKG   Radiology No results found.  Procedures Procedures (including critical care time)  Medications Ordered in UC Medications - No data to display  Initial Impression / Assessment and Plan / UC Course  I have reviewed the triage vital signs and the nursing notes.  Pertinent labs & imaging results that were available during my care of the patient were reviewed by me and considered in my medical decision making (see chart for details).     Mild tachycardia noted, negative rapid covid testing without any known exposure. No red flag findings on exam with sinus symptoms which started yesterday. History of sinusitis. Likely viral at this point with supportive cares recommended. Have and hold antibiotics provided for worsening of symptoms. Return precautions provided. Patient verbalized understanding and agreeable to plan.   Final Clinical Impressions(s) / UC Diagnoses   Final diagnoses:  Acute sinusitis, recurrence not specified, unspecified location     Discharge Instructions     Your rapid covid-19 test is negative.  As there can be false negatives, we are running a confirmation test. This should result in the next 2-3 days.  Self isolate until covid results are back and negative.  Will notify you by phone of any positive findings. Your negative results will be sent through your MyChart.      Your symptoms are still likely viral in nature, so I would recommend waiting for the next few days and treating your symptoms at home to see how your illness progresses or subsides.  If worsening of symptoms by Saturday I have sent antibiotics you may fill. If any improvement or resolution no need to take this.  Mucinex as an expectorant, nasal saline spray every few hours, or neti pot use, to promote drainage.  Daily flonase when  used regularly may also help relieve some symptoms.  If symptoms worsen or do not improve in the next week or with antibiotics to return to be seen or to follow up with your PCP.  ED Prescriptions    Medication Sig Dispense Auth. Provider   fluticasone (FLONASE) 50 MCG/ACT nasal spray Place 1 spray into both nostrils daily. 16 g Augusto Gamble B, NP   doxycycline (VIBRAMYCIN) 100 MG capsule Take 1 capsule (100 mg total) by mouth 2 (two) times daily. 20 capsule Zigmund Gottron, NP     PDMP not reviewed this encounter.   Zigmund Gottron, NP 09/27/19 1029

## 2019-09-27 NOTE — ED Triage Notes (Signed)
Patient complains of sinus pain and pressure that started yesterday morning. States that she has had nasal congestion and runny nose. Discussed possible need for covid testing.  Patient states that she also has left ear pain.

## 2019-09-27 NOTE — Discharge Instructions (Signed)
Your rapid covid-19 test is negative.  As there can be false negatives, we are running a confirmation test. This should result in the next 2-3 days.  Self isolate until covid results are back and negative.  Will notify you by phone of any positive findings. Your negative results will be sent through your MyChart.      Your symptoms are still likely viral in nature, so I would recommend waiting for the next few days and treating your symptoms at home to see how your illness progresses or subsides.  If worsening of symptoms by Saturday I have sent antibiotics you may fill. If any improvement or resolution no need to take this.  Mucinex as an expectorant, nasal saline spray every few hours, or neti pot use, to promote drainage.  Daily flonase when used regularly may also help relieve some symptoms.  If symptoms worsen or do not improve in the next week or with antibiotics to return to be seen or to follow up with your PCP.

## 2019-09-28 LAB — NOVEL CORONAVIRUS, NAA (HOSP ORDER, SEND-OUT TO REF LAB; TAT 18-24 HRS): SARS-CoV-2, NAA: NOT DETECTED

## 2020-01-31 DIAGNOSIS — G4733 Obstructive sleep apnea (adult) (pediatric): Secondary | ICD-10-CM | POA: Insufficient documentation

## 2020-04-10 ENCOUNTER — Other Ambulatory Visit: Payer: Self-pay

## 2020-04-10 ENCOUNTER — Ambulatory Visit
Admission: EM | Admit: 2020-04-10 | Discharge: 2020-04-10 | Disposition: A | Discharge status: Home / Self Care | Payer: 59 | Attending: Family Medicine | Admitting: Family Medicine

## 2020-04-10 DIAGNOSIS — J029 Acute pharyngitis, unspecified: Secondary | ICD-10-CM

## 2020-04-10 LAB — GROUP A STREP BY PCR: Group A Strep by PCR: NOT DETECTED

## 2020-04-10 MED ORDER — LIDOCAINE VISCOUS HCL 2 % MT SOLN: OROMUCOSAL | 0 refills | Status: DC

## 2020-04-10 MED ORDER — CETIRIZINE-PSEUDOEPHEDRINE ER 5-120 MG PO TB12: 1.0000 | ORAL_TABLET | Freq: Two times a day (BID) | ORAL | 0 refills | Status: DC

## 2020-04-10 MED ORDER — KETOROLAC TROMETHAMINE 10 MG PO TABS: 10.0000 mg | ORAL_TABLET | Freq: Four times a day (QID) | ORAL | 0 refills | Status: DC | PRN

## 2020-04-10 NOTE — Discharge Instructions (Signed)
I will call with result.  Take care  Dr. Adriana Simas

## 2020-04-10 NOTE — ED Triage Notes (Signed)
Patient complains of sore throat that started 6 days. Patient states her throat hurts worse than when she has had strep in the past. Patient states that she has noticed some congestion in her throat as well.

## 2020-04-10 NOTE — ED Provider Notes (Signed)
[facility]    CSN: [id number] Arrival date & time: 04/10/20  3536      History   Chief Complaint Chief Complaint  Patient presents with   Sore Throat   HPI  34 year old female presents with sore throat.  Patient reports that her symptoms started 6 days ago. Her husband and children have been sick with upper respiratory symptoms. She states that hers is a little bit different. She reports sore throat and mild cough and congestion. Her predominant complaint is sore throat. Pain 5/10 in severity. Makes swallowing difficult. She has taken DayQuil and NyQuil with some improvement. No fever. Also reports fatigue and change in appetite. No other associated symptoms. No other complaints.   Past Medical History:  Diagnosis Date   Vertigo    Past Surgical History:  Procedure Laterality Date   FOOT SURGERY      OB History   No obstetric history on file.    Home Medications    Prior to Admission medications   Medication Sig Start Date End Date Taking? Authorizing Provider  DULoxetine (CYMBALTA) 20 MG capsule Take by mouth. 03/17/19 04/10/20 Yes [provider]  cetirizine-pseudoephedrine (ZYRTEC-D) 5-120 MG tablet Take 1 tablet by mouth 2 (two) times daily. 04/10/20   Tommie Sams, DO  ketorolac (TORADOL) 10 MG tablet Take 1 tablet (10 mg total) by mouth every 6 (six) hours as needed for moderate pain or severe pain. 04/10/20   Tommie Sams, DO  lidocaine (XYLOCAINE) 2 % solution Gargle 15 mL every 3 hours as needed. May swallow if desired. 04/10/20   Tommie Sams, DO  fluticasone (FLONASE) 50 MCG/ACT nasal spray Place 1 spray into both nostrils daily. 09/27/19 04/10/20  Georgetta Haber, NP    Family History Family History  Problem Relation Age of Onset   Healthy Mother    Healthy Father     Social History Social History   Tobacco Use   Smoking status: Never Smoker   Smokeless tobacco: Never Used  Building services engineer Use: Never used  Substance Use  Topics   Alcohol use: Yes    Comment: occasionally   Drug use: No     Allergies   Sulfa antibiotics   Review of Systems Review of Systems  Constitutional: Positive for appetite change and fatigue. Negative for fever.  HENT: Positive for congestion and sore throat.   Respiratory: Positive for cough.    Physical Exam Triage Vital Signs ED Triage Vitals  Enc Vitals Group     BP 04/10/20 0926 (!) 145/120     Pulse Rate 04/10/20 0926 (!) 106     Resp 04/10/20 0926 18     Temp 04/10/20 0926 98.5 F (36.9 C)     Temp Source 04/10/20 0926 Oral     SpO2 04/10/20 0926 99 %     Weight 04/10/20 0922 230 lb (104.3 kg)     Height 04/10/20 0922 5\' 8"  (1.727 m)     Head Circumference --      Peak Flow --      Pain Score 04/10/20 0922 5     Pain Loc --      Pain Edu? --      Excl. in [place]? --    Updated Vital Signs BP (!) 145/120 (BP Location: Right Arm)    Pulse (!) 106    Temp 98.5 F (36.9 C) (Oral)    Resp 18    Ht 5\' 8"  (1.727 m)  Wt 104.3 kg    LMP 03/17/2020    SpO2 99%    BMI 34.97 kg/m   Visual Acuity Right Eye Distance:   Left Eye Distance:   Bilateral Distance:    Right Eye Near:   Left Eye Near:    Bilateral Near:     Physical Exam Vitals and nursing note reviewed.  Constitutional:      General: She is not in acute distress.    Appearance: Normal appearance. She is obese. She is not ill-appearing.  HENT:     Head: Normocephalic and atraumatic.     Mouth/Throat:     Pharynx: Posterior oropharyngeal erythema present.     Tonsils: No tonsillar exudate.     Comments: Mucus noted. Eyes:     General:        Right eye: No discharge.        Left eye: No discharge.     Conjunctiva/sclera: Conjunctivae normal.  Cardiovascular:     Rate and Rhythm: Regular rhythm. Tachycardia present.  Pulmonary:     Effort: Pulmonary effort is normal.     Breath sounds: Normal breath sounds. No wheezing, rhonchi or rales.  Neurological:     Mental Status: She is alert.    Psychiatric:        Mood and Affect: Mood normal.        Behavior: Behavior normal.    UC Treatments / Results  Labs (all labs ordered are listed, but only abnormal results are displayed) Labs Reviewed  GROUP A STREP BY PCR    EKG   Radiology No results found.  Procedures Procedures (including critical care time)  Medications Ordered in UC Medications - No data to display  Initial Impression / Assessment and Plan / UC Course  I have reviewed the triage vital signs and the nursing notes.  Pertinent labs & imaging results that were available during my care of the patient were reviewed by me and considered in my medical decision making (see chart for details).    34 year old female presents with viral pharyngitis.  Strep PCR negative.  Treated with viscous lidocaine, Toradol, Zyrtec-D.  Final Clinical Impressions(s) / UC Diagnoses   Final diagnoses:  Pharyngitis, unspecified etiology     Discharge Instructions     I will call with result.  Take care  Dr. Adriana Simas     ED Prescriptions    Medication Sig Dispense Auth. Provider   lidocaine (XYLOCAINE) 2 % solution Gargle 15 mL every 3 hours as needed. May swallow if desired. 200 mL Tomia Enlow G, DO   ketorolac (TORADOL) 10 MG tablet Take 1 tablet (10 mg total) by mouth every 6 (six) hours as needed for moderate pain or severe pain. 20 tablet Jozy Mcphearson G, DO   cetirizine-pseudoephedrine (ZYRTEC-D) 5-120 MG tablet Take 1 tablet by mouth 2 (two) times daily. 30 tablet Tommie Sams, DO     PDMP not reviewed this encounter.   Tommie Sams, [place] 04/10/20 1007

## 2021-04-10 ENCOUNTER — Other Ambulatory Visit: Payer: Self-pay | Admitting: Sports Medicine

## 2021-04-10 DIAGNOSIS — M7541 Impingement syndrome of right shoulder: Secondary | ICD-10-CM

## 2021-04-10 DIAGNOSIS — M25511 Pain in right shoulder: Secondary | ICD-10-CM

## 2021-04-10 DIAGNOSIS — M25311 Other instability, right shoulder: Secondary | ICD-10-CM

## 2021-04-10 DIAGNOSIS — M7551 Bursitis of right shoulder: Secondary | ICD-10-CM

## 2021-04-15 DIAGNOSIS — I10 Essential (primary) hypertension: Secondary | ICD-10-CM | POA: Insufficient documentation

## 2021-04-24 ENCOUNTER — Ambulatory Visit
Admission: RE | Admit: 2021-04-24 | Discharge: 2021-04-24 | Disposition: A | Discharge status: Home / Self Care | Payer: 59 | Source: Ambulatory Visit | Attending: Sports Medicine | Admitting: Sports Medicine

## 2021-04-24 ENCOUNTER — Other Ambulatory Visit: Payer: Self-pay

## 2021-04-24 DIAGNOSIS — M7541 Impingement syndrome of right shoulder: Secondary | ICD-10-CM | POA: Diagnosis not present

## 2021-04-24 DIAGNOSIS — M25311 Other instability, right shoulder: Secondary | ICD-10-CM | POA: Insufficient documentation

## 2021-04-24 DIAGNOSIS — M25511 Pain in right shoulder: Secondary | ICD-10-CM

## 2021-04-24 DIAGNOSIS — M7551 Bursitis of right shoulder: Secondary | ICD-10-CM

## 2021-04-24 MED ORDER — IOHEXOL 180 MG/ML  SOLN
10.0000 mL | Freq: Once | INTRAMUSCULAR | Status: AC | PRN
  Administered 2021-04-24: 10 mL

## 2021-04-24 MED ORDER — LIDOCAINE HCL (PF) 1 % IJ SOLN
10.0000 mL | Freq: Once | INTRAMUSCULAR | Status: AC
  Administered 2021-04-24: 10 mL
  Filled 2021-04-24: qty 10

## 2021-04-24 MED ORDER — GADOBUTROL 1 MMOL/ML IV SOLN
0.0500 mL | Freq: Once | INTRAVENOUS | Status: AC | PRN
  Administered 2021-04-24: 0.05 mL

## 2021-09-15 ENCOUNTER — Ambulatory Visit
Admission: EM | Admit: 2021-09-15 | Discharge: 2021-09-15 | Disposition: A | Discharge status: Home / Self Care | Payer: BC Managed Care – PPO

## 2021-09-15 ENCOUNTER — Other Ambulatory Visit: Payer: Self-pay

## 2021-09-15 DIAGNOSIS — H1033 Unspecified acute conjunctivitis, bilateral: Secondary | ICD-10-CM

## 2021-09-15 MED ORDER — MOXIFLOXACIN HCL 0.5 % OP SOLN: 1.0000 [drp] | Freq: Three times a day (TID) | OPHTHALMIC | 0 refills | Status: AC

## 2021-09-15 NOTE — Discharge Instructions (Addendum)
Instill 1 drop of Vigamox in each eye every 8 hours for the next 7 days for treatment of your conjunctivitis.  Avoid touching your eyes as much as possible.  Wipe down all surfaces, countertops, and doorknobs after the first and second 24 hours on eyedrops.  Wash her face with a clean wash rag to remove any drainage and use a different portion of the wash rag to clean each eye so as to not reinfect yourself.  Discard your current contacts, wash your case thoroughly, and start with a new pair after you finish treatment.  Return for reevaluation for any new or worsening symptoms.

## 2021-09-15 NOTE — ED Triage Notes (Signed)
Pt here with C/O eye drainage, itching, red for 6 days.

## 2021-09-15 NOTE — ED Provider Notes (Signed)
[facility]    CSN: [id number] Arrival date & time: 09/15/21  0859      History   Chief Complaint Chief Complaint  Patient presents with   Eye Drainage    HPI Carrie Benjamin is a 36 y.o. female.   HPI  36 year old female here for evaluation of eye complaints.  Patient reports that for last 6 days she has been experiencing eye redness, itching, and drainage.  Her symptoms began in her right eye and then spread to her left eye a day or so after onset.  She had swelling to both eyelids, yellow mucoid discharge, and mild light sensitivity.  She is also complaining of mild blurry vision but states that she is used to contacts and she is wearing an older pair of glasses.  Unable to check visual acuity because the patient's glasses kept walking up each time she tried to read the eye chart.  She is able to read newsprint however.  She denies any other changes in vision.  Her niece had pinkeye and then her son had eye redness for approximately 24 hours and those are her only known potential exposures.  Past Medical History:  Diagnosis Date   Vertigo     There are no problems to display for this patient.   Past Surgical History:  Procedure Laterality Date   FOOT SURGERY      OB History   No obstetric history on file.      Home Medications    Prior to Admission medications   Medication Sig Start Date End Date Taking? Authorizing Provider  DULoxetine (CYMBALTA) 20 MG capsule Take by mouth. 03/17/19 09/15/21 Yes [provider]  moxifloxacin (VIGAMOX) 0.5 % ophthalmic solution Place 1 drop into both eyes 3 (three) times daily for 7 days. 09/15/21 09/22/21 Yes Becky Augusta, NP  olmesartan (BENICAR) 20 MG tablet Take by mouth. 04/15/21 04/15/22 Yes [provider]  fluticasone (FLONASE) 50 MCG/ACT nasal spray Place 1 spray into both nostrils daily. 09/27/19 04/10/20  Georgetta Haber, NP    Family History Family History  Problem Relation Age of  Onset   Healthy Mother    Healthy Father     Social History Social History   Tobacco Use   Smoking status: Never   Smokeless tobacco: Never  Vaping Use   Vaping Use: Never used  Substance Use Topics   Alcohol use: Yes    Comment: occasionally   Drug use: No     Allergies   Sulfa antibiotics   Review of Systems Review of Systems  Constitutional:  Negative for fever.  Eyes:  Positive for photophobia, discharge, redness, itching and visual disturbance. Negative for pain.  Hematological: Negative.   Psychiatric/Behavioral: Negative.      Physical Exam Triage Vital Signs ED Triage Vitals  Enc Vitals Group     BP 09/15/21 0915 111/76     Pulse Rate 09/15/21 0915 (!) 103     Resp 09/15/21 0915 18     Temp 09/15/21 0915 98.5 F (36.9 C)     Temp Source 09/15/21 0915 Oral     SpO2 09/15/21 0915 100 %     Weight 09/15/21 0912 250 lb (113.4 kg)     Height 09/15/21 0912 5\' 8"  (1.727 m)     Head Circumference --      Peak Flow --      Pain Score 09/15/21 0912 0     Pain Loc --  Pain Edu? --      Excl. in [place]? --    No data found.  Updated Vital Signs BP 111/76 (BP Location: Left Arm)    Pulse (!) 103    Temp 98.5 F (36.9 C) (Oral)    Resp 18    Ht 5\' 8"  (1.727 m)    Wt 250 lb (113.4 kg)    LMP 09/06/2021    SpO2 100%    BMI 38.01 kg/m   Visual Acuity Right Eye Distance:   Left Eye Distance:   Bilateral Distance:    Right Eye Near:   Left Eye Near:    Bilateral Near:     Physical Exam Vitals and nursing note reviewed.  Constitutional:      General: She is not in acute distress.    Appearance: Normal appearance. She is not ill-appearing.  HENT:     Head: Normocephalic and atraumatic.  Eyes:     General: No scleral icterus.       Right eye: Discharge present.        Left eye: Discharge present.    Extraocular Movements: Extraocular movements intact.     Pupils: Pupils are equal, round, and reactive to light.  Skin:    General: Skin is warm and  dry.     Capillary Refill: Capillary refill takes less than 2 seconds.     Findings: No erythema or rash.  Neurological:     General: No focal deficit present.     Mental Status: She is alert and oriented to person, place, and time.  Psychiatric:        Mood and Affect: Mood normal.        Behavior: Behavior normal.        Thought Content: Thought content normal.        Judgment: Judgment normal.     UC Treatments / Results  Labs (all labs ordered are listed, but only abnormal results are displayed) Labs Reviewed - No data to display  EKG   Radiology No results found.  Procedures Procedures (including critical care time)  Medications Ordered in UC Medications - No data to display  Initial Impression / Assessment and Plan / UC Course  I have reviewed the triage vital signs and the nursing notes.  Pertinent labs & imaging results that were available during my care of the patient were reviewed by me and considered in my medical decision making (see chart for details).  Patient is a pleasant, nontoxic-appearing 36 year old female here for evaluation of bilateral eye redness, itching, drainage that began 6 days ago as outlined in HPI above.  I attempted to check the patient's physical acuity but she is wearing an older pair of glasses and each time she went to turn to the chart her glasses but forgot to put her mask.  She is able to raise newsprint however.  On exam patient has bilateral erythematous and injected bulbar and labral conjunctiva.  There is yellow mucoid discharge in upper and lower lashes of both eyes as well as in the outer canthus of both eyes.  Patient has a normal red light reflex and her pupils are equal round and reactive.  EOMs intact.  Patient exam is consistent with conjunctivitis.  We will treat her with Vigamox 3 times daily x7 days.  I cautioned her to wipe down all of her surfaces, countertops, doorknobs to prevent reinoculation herself as well as change her  bath linen and pillowcase after the first and second  24 hours and eyedrops as well.  Patient denies need for work note.  I have suggested that if her symptoms worsen she needs to see her eye doctor.   Final Clinical Impressions(s) / UC Diagnoses   Final diagnoses:  Acute conjunctivitis of both eyes, unspecified acute conjunctivitis type     Discharge Instructions      Instill 1 drop of Vigamox in each eye every 8 hours for the next 7 days for treatment of your conjunctivitis.  Avoid touching your eyes as much as possible.  Wipe down all surfaces, countertops, and doorknobs after the first and second 24 hours on eyedrops.  Wash her face with a clean wash rag to remove any drainage and use a different portion of the wash rag to clean each eye so as to not reinfect yourself.  Discard your current contacts, wash your case thoroughly, and start with a new pair after you finish treatment.  Return for reevaluation for any new or worsening symptoms.      ED Prescriptions     Medication Sig Dispense Auth. Provider   moxifloxacin (VIGAMOX) 0.5 % ophthalmic solution Place 1 drop into both eyes 3 (three) times daily for 7 days. 3 mL Margarette Canada, NP      PDMP not reviewed this encounter.   Margarette Canada, NP 09/15/21 [phone]

## 2022-04-10 IMAGING — MR MR SHOULDER*R* W/CM
6 series · 40 of 40 positions shown · IV contrast (agent unspecified)
Comparison: Injection images same date.

CLINICAL DATA: Painful range of motion for 3 months. No acute
injury or prior relevant surgery. Subacromial impingement,
instability and subacromial bursitis.

EXAM:
MR ARTHROGRAM OF THE RIGHT SHOULDER
TECHNIQUE: Multiplanar, multisequence MR imaging of the right shoulder was
performed following the administration of intra-articular contrast.
CONTRAST:  See Injection Documentation.

[Series 5: T1 fat-sat · axial · right · 4.0mm · 0.55mm/px · z∈[-41,+71]mm · 6 of 25 slices shown (1 of 3)]
[im 1/25]
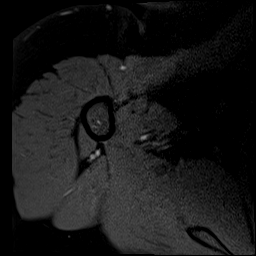
[im 5/25]
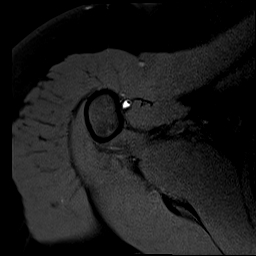
[im 10/25]
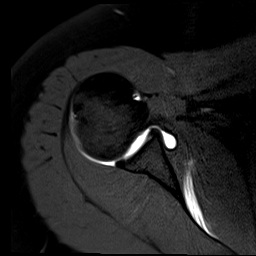
[im 15/25]
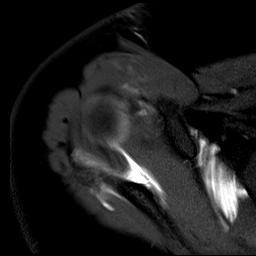
[im 20/25]
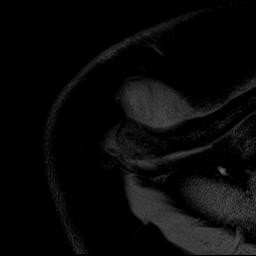
[im 25/25]
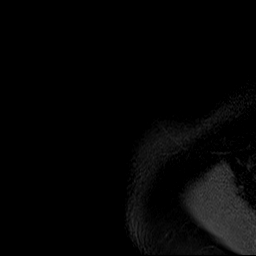

[Series 6: T1 fat-sat · oblique · right · 4.0mm · 0.55mm/px · 6 of 25 slices shown (2 of 3)]
[im 1/25]
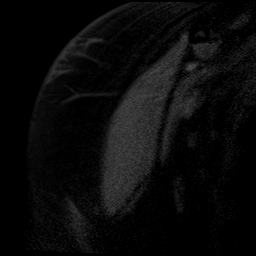
[im 5/25]
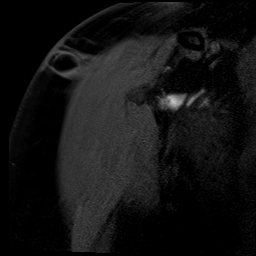
[im 10/25]
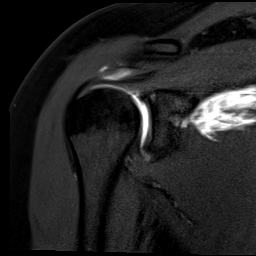
[im 15/25]
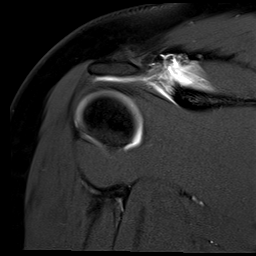
[im 20/25]
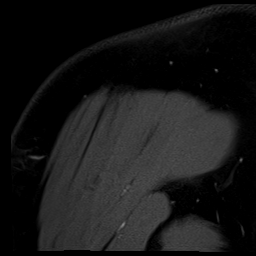
[im 25/25]
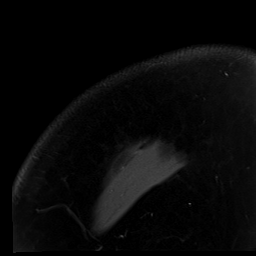

[Series 7: T2 fat-sat · oblique · right · 4.0mm · 0.55mm/px · 7 of 26 slices shown (1 of 2)]
[im 1/26]
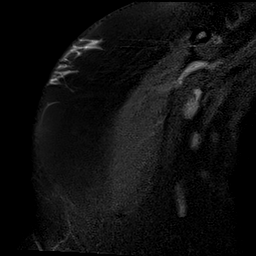
[im 5/26]
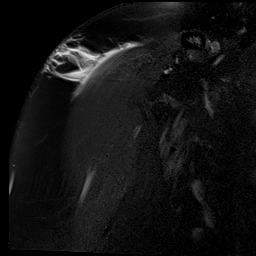
[im 9/26]
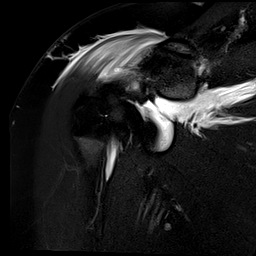
[im 13/26]
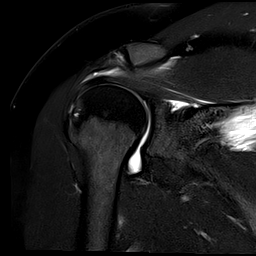
[im 17/26]
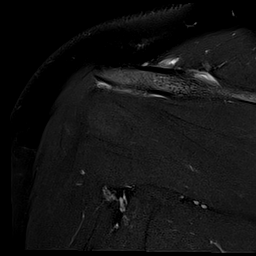
[im 21/26]
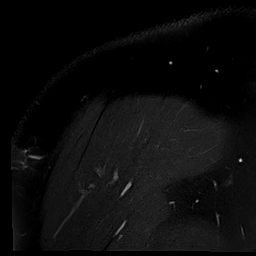
[im 26/26]
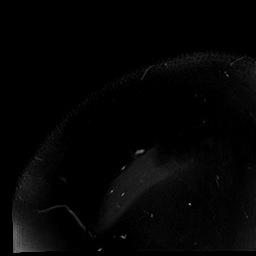

[Series 8: T1 · oblique · right · 4.0mm · 0.51mm/px · 7 of 26 slices shown]
[im 1/26]
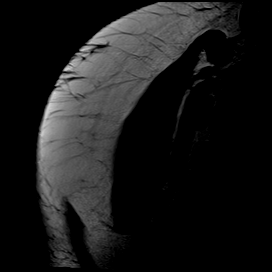
[im 5/26]
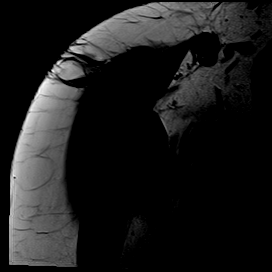
[im 9/26]
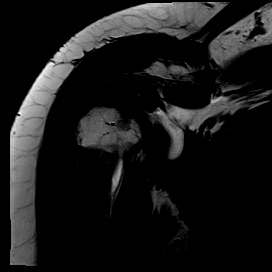
[im 13/26]
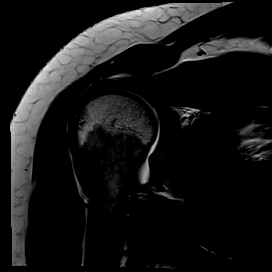
[im 17/26]
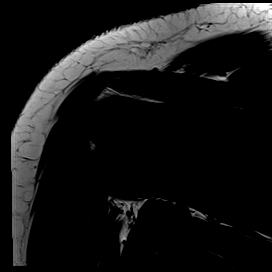
[im 21/26]
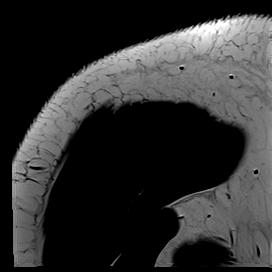
[im 26/26]
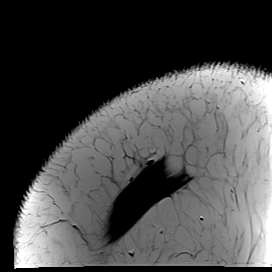

[Series 9: T2 fat-sat · oblique · right · 4.0mm · 0.55mm/px · 7 of 25 slices shown (2 of 2)]
[im 1/25]
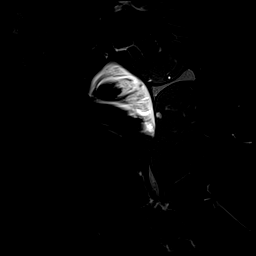
[im 5/25]
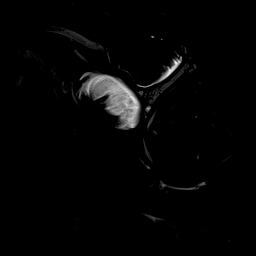
[im 9/25]
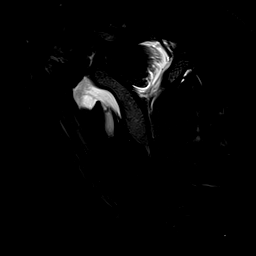
[im 13/25]
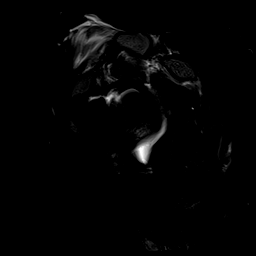
[im 17/25]
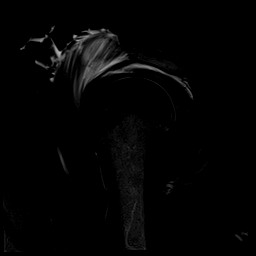
[im 21/25]
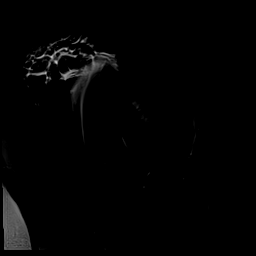
[im 25/25]
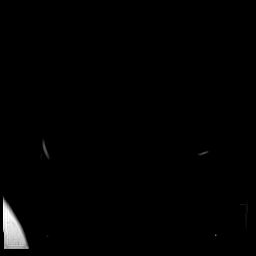

[Series 12: T1 fat-sat · sagittal · right · 4.0mm · 0.62mm/px · 7 of 26 slices shown (3 of 3)]
[im 1/26]
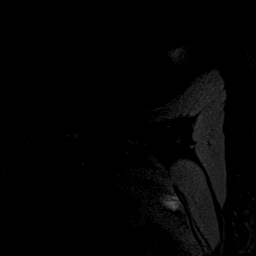
[im 5/26]
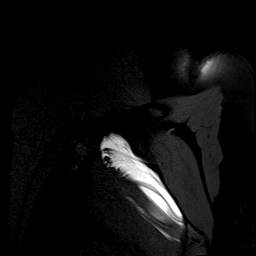
[im 9/26]
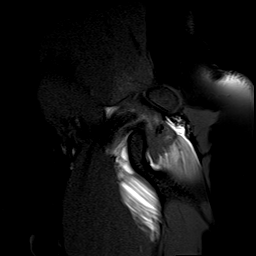
[im 13/26]
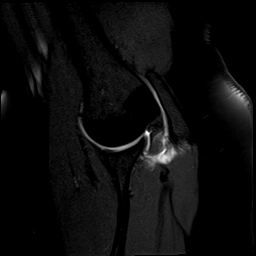
[im 17/26]
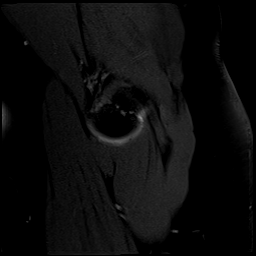
[im 21/26]
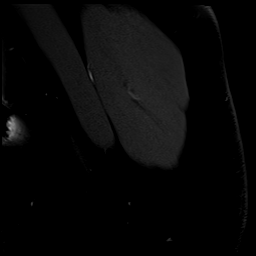
[im 26/26]
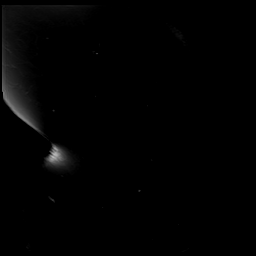

[40 of 40 positions shown; findings below may reference images not displayed]

FINDINGS: Rotator cuff: There is T1 and T2 hyperintensity along the bursal
surface of the supraspinatus musculotendinous junction attributed to
the injection. No evidence of rotator cuff tear. The additional
rotator cuff tendons appear normal.

Muscles: T2 hyperintensity anteriorly in the deltoid muscle
attributed to the injection. There is leakage of contrast from the
joint along the anterior aspect of the scapula, into the substance
of the subscapularis and supraspinatus muscles. No muscular atrophy.

Biceps long head: The long head of the biceps tendon is quite
diminutive, but uniform in size. The tendon is normally located in
the bicipital groove.

Acromioclavicular Joint: The acromion is type 1. No significant
abnormality of the acromioclavicular joint. There is some contrast
within the subacromial-subdeltoid bursa which is likely related to
the injection.

Glenohumeral Joint: No significant glenohumeral arthropathy. As
above, there is leakage of contrast from the joint, along the
anterior aspect of the scapula. Contrast extends into the
subscapularis and supraspinatus muscles.

Labrum: No evidence of labral tear or paralabral cyst. Glenohumeral
ligaments appear intact on the ABER images.

Bones: No acute or significant extra-articular osseous
findings.There is subcortical cyst formation in the humeral head
near the infraspinatus insertion. Intra-articular contrast seeps
into the cysts.

Other: No significant soft tissue findings allowing for contrast
leakage from the joint. Small axillary lymph nodes are likely
reactive.
IMPRESSION: 1. No definite abnormality identified.
2. The biceps tendon is small, but uniform in size and normally
positioned. This may reflect a normal variant.
3. Rotator cuff assessment mildly limited by contrast leakage from
the joint into the subscapularis and supraspinatus muscles. No
evidence of rotator cuff tear.
4. The labrum appears intact.

## 2022-11-11 ENCOUNTER — Ambulatory Visit
Admission: EM | Admit: 2022-11-11 | Discharge: 2022-11-11 | Disposition: A | Discharge status: Home / Self Care | Payer: BC Managed Care – PPO | Attending: Family Medicine | Admitting: Family Medicine

## 2022-11-11 ENCOUNTER — Encounter: Payer: Self-pay | Admitting: Emergency Medicine

## 2022-11-11 DIAGNOSIS — R300 Dysuria: Secondary | ICD-10-CM | POA: Diagnosis present

## 2022-11-11 LAB — PREGNANCY, URINE: Preg Test, Ur: NEGATIVE

## 2022-11-11 LAB — URINALYSIS, W/ REFLEX TO CULTURE (INFECTION SUSPECTED)
Bilirubin Urine: NEGATIVE
Glucose, UA: NEGATIVE mg/dL
Ketones, ur: NEGATIVE mg/dL
Nitrite: NEGATIVE
Protein, ur: NEGATIVE mg/dL
Specific Gravity, Urine: 1.01 (ref 1.005–1.030)
WBC, UA: >50 WBC/hpf (ref 0–5)
pH: 7 (ref 5.0–8.0)

## 2022-11-11 MED ORDER — CEPHALEXIN 500 MG PO CAPS: 500.0000 mg | ORAL_CAPSULE | Freq: Four times a day (QID) | ORAL | 0 refills | Status: DC

## 2022-11-11 NOTE — Discharge Instructions (Addendum)
Your urine sample did show signs of a urinary tract infection.  I sent the urine for culture.  Someone may contact you to change or stop the antibiotics. Stop by the pharmacy to pick up your prescriptions.  For your UTI: Take Keflex 4 times a day for the next 5 days  If your symptoms do not improve in the next 7 days, be sure to follow-up here or at your primary care provider office.  Go to the emergency department if you are having increasing pain, worsening vaginal bleeding or fever.

## 2022-11-11 NOTE — ED Triage Notes (Signed)
Pt presents with urinary urgency and lower abdominal pain after urinating since yesterday.

## 2022-11-11 NOTE — ED Provider Notes (Addendum)
[facility]    CSN: [id number] Arrival date & time: 11/11/22  1148      History   Chief Complaint Chief Complaint  Patient presents with   Abdominal Pain    I think that I have a UTI - Entered by patient   Urinary Urgency      HPI HPI Carrie Benjamin is a 37 y.o. female.    Carrie Benjamin presents for dysuria, urinary frequency, lower abdominal pain that started last night.  Tried Tylenol prior to arrival.  Has  not had any antibiotics in last 30 days.   Denies known STI exposure.   Reports no symptoms in her partner. She is  not currently pregnant.  Her last period was late about 1 week. Patient's last menstrual period was 11/07/2022.  Her husband had a vasectomy.   - Abnormal vaginal discharge: np - vaginal bleeding: no - Dysuria: yes - Hematuria: no - Urinary urgency: yes - Urinary frequency: yes  - Fever: no - Abdominal pain yes - Pelvic pain: no - Rash/Skin lesions/mouth ulcers: no - Nausea: yes - Vomiting: no  - Back Pain: yes        Past Medical History:  Diagnosis Date   Vertigo     There are no problems to display for this patient.   Past Surgical History:  Procedure Laterality Date   FOOT SURGERY      OB History   No obstetric history on file.      Home Medications    Prior to Admission medications   Medication Sig Start Date End Date Taking? Authorizing Provider  cephALEXin (KEFLEX) 500 MG capsule Take 1 capsule (500 mg total) by mouth 4 (four) times daily. 11/11/22  Yes Dariya Gainer, DO  DULoxetine (CYMBALTA) 20 MG capsule Take by mouth. 03/17/19 09/15/21  [provider]  olmesartan (BENICAR) 20 MG tablet Take by mouth. 04/15/21 04/15/22  [provider]  fluticasone (FLONASE) 50 MCG/ACT nasal spray Place 1 spray into both nostrils daily. 09/27/19 04/10/20  Zigmund Gottron, NP    Family History Family History  Problem Relation Age of Onset   Healthy Mother    Healthy Father      Social History Social History   Tobacco Use   Smoking status: Never   Smokeless tobacco: Never  Vaping Use   Vaping Use: Never used  Substance Use Topics   Alcohol use: Yes    Comment: occasionally   Drug use: No     Allergies   Sulfa antibiotics   Review of Systems Review of Systems: :negative unless otherwise stated in HPI.      Physical Exam Triage Vital Signs ED Triage Vitals  Enc Vitals Group     BP 11/11/22 1210 118/78     Pulse Rate 11/11/22 1210 94     Resp 11/11/22 1210 16     Temp 11/11/22 1210 98.4 F (36.9 C)     Temp Source 11/11/22 1210 Oral     SpO2 11/11/22 1210 98 %     Weight --      Height --      Head Circumference --      Peak Flow --      Pain Score 11/11/22 1207 3     Pain Loc --      Pain Edu? --      Excl. in [place]? --    No data found.  Updated Vital Signs BP 118/78 (BP Location: Left  Arm)   Pulse 94   Temp 98.4 F (36.9 C) (Oral)   Resp 16   LMP 11/07/2022   SpO2 98%   Visual Acuity Right Eye Distance:   Left Eye Distance:   Bilateral Distance:    Right Eye Near:   Left Eye Near:    Bilateral Near:     Physical Exam GEN: Uncomfortable appearing female in no acute distress  CVS: well perfused, regular rate RESP: speaking in full sentences without pause  ABD: soft, non-distended, no palpable masses, no CVA tenderness   MSK: No paraspinal tenderness SKIN: Warm and dry   UC Treatments / Results  Labs (all labs ordered are listed, but only abnormal results are displayed) Labs Reviewed  URINALYSIS, W/ REFLEX TO CULTURE (INFECTION SUSPECTED) - Abnormal; Notable for the following components:      Result Value   Color, Urine STRAW (*)    APPearance HAZY (*)    Hgb urine dipstick MODERATE (*)    Leukocytes,Ua SMALL (*)    Bacteria, UA FEW (*)    All other components within normal limits  URINE CULTURE  PREGNANCY, URINE    EKG   Radiology No results found.  Procedures Procedures (including critical care  time)  Medications Ordered in UC Medications - No data to display  Initial Impression / Assessment and Plan / UC Course  I have reviewed the triage vital signs and the nursing notes.  Pertinent labs & imaging results that were available during my care of the patient were reviewed by me and considered in my medical decision making (see chart for details).      Acute cystitis:  Patient is a 37 y.o. female  who presents for 1 day of dysuria and urinary urgency with lower abdominal pain.  Overall patient is uncomfortable appearing and afebrile.  Vital signs stable.  UA consistent with likely early onset acute cystitis. Hematuria not supported on microscopy. Treat with Keflex 4 times daily for 5 days.  Urine culture obtained.  Urine pregnancy test is negative.  Follow-up sensitivities and change antibiotics, if needed. Return precautions including abdominal pain, fever, chills, nausea, or vomiting given.   Discussed MDM, treatment plan and plan for follow-up with patient who agrees with plan.        Final Clinical Impressions(s) / UC Diagnoses   Final diagnoses:  Dysuria     Discharge Instructions      Your urine sample did show signs of a urinary tract infection.  I sent the urine for culture.  Someone may contact you to change or stop the antibiotics. Stop by the pharmacy to pick up your prescriptions.  For your UTI: Take Keflex 4 times a day for the next 5 days  If your symptoms do not improve in the next 7 days, be sure to follow-up here or at your primary care provider office.  Go to the emergency department if you are having increasing pain, worsening vaginal bleeding or fever.      ED Prescriptions     Medication Sig Dispense Auth. Provider   cephALEXin (KEFLEX) 500 MG capsule Take 1 capsule (500 mg total) by mouth 4 (four) times daily. 20 capsule Lyndee Hensen, DO      PDMP not reviewed this encounter.      Lyndee Hensen, DO 11/11/22 1304

## 2022-11-12 LAB — URINE CULTURE

## 2023-03-24 ENCOUNTER — Ambulatory Visit
Admission: EM | Admit: 2023-03-24 | Discharge: 2023-03-24 | Disposition: A | Discharge status: Home / Self Care | Payer: BC Managed Care – PPO | Attending: Emergency Medicine | Admitting: Emergency Medicine

## 2023-03-24 DIAGNOSIS — J069 Acute upper respiratory infection, unspecified: Secondary | ICD-10-CM | POA: Diagnosis not present

## 2023-03-24 MED ORDER — AMOXICILLIN-POT CLAVULANATE 875-125 MG PO TABS: 1.0000 | ORAL_TABLET | Freq: Two times a day (BID) | ORAL | 0 refills | Status: DC

## 2023-03-24 NOTE — Discharge Instructions (Addendum)
Drink plenty of fluids. Rest, push fluids, take meds as directed. Avoid sun exposure as any antbiotics can make you more sensitive or cause upset stomach. Follow up with PCP. Return as needed.

## 2023-03-24 NOTE — ED Triage Notes (Signed)
Pt c/o cough, L sided facial pain & HA x7 days. States sx's worsening daily. Has tried Dayquil,Nyquil & tylenol w/o relief.

## 2023-03-24 NOTE — ED Provider Notes (Signed)
[facility]    CSN: [id number] Arrival date & time: 03/24/23  1608      History   Chief Complaint Chief Complaint  Patient presents with   Cough   Headache   Facial Pain    HPI Carrie Benjamin is a 37 y.o. female.   37 year old female, Carrie Benjamin, presents to urgent care for evaluation of cough, left sided facial pain, HA x 7 days, getting worse. Pt reports trying OTC meds without relief.   The history is provided by the patient. No language interpreter was used.    Past Medical History:  Diagnosis Date   Vertigo     Patient Active Problem List   Diagnosis Date Noted   Acute upper respiratory infection 03/24/2023    Past Surgical History:  Procedure Laterality Date   FOOT SURGERY      OB History   No obstetric history on file.      Home Medications    Prior to Admission medications   Medication Sig Start Date End Date Taking? Authorizing Provider  amoxicillin-clavulanate (AUGMENTIN) 875-125 MG tablet Take 1 tablet by mouth every 12 (twelve) hours. 03/24/23  Yes Paige Monarrez, Para March, NP  olmesartan (BENICAR) 20 MG tablet Take by mouth. 04/15/21 03/24/23 Yes [provider]  cephALEXin (KEFLEX) 500 MG capsule Take 1 capsule (500 mg total) by mouth 4 (four) times daily. 11/11/22   Katha Cabal, DO  DULoxetine (CYMBALTA) 20 MG capsule Take by mouth. 03/17/19 09/15/21  [provider]  fluticasone (FLONASE) 50 MCG/ACT nasal spray Place 1 spray into both nostrils daily. 09/27/19 04/10/20  Georgetta Haber, NP    Family History Family History  Problem Relation Age of Onset   Healthy Mother    Healthy Father     Social History Social History   Tobacco Use   Smoking status: Never   Smokeless tobacco: Never  Vaping Use   Vaping status: Never Used  Substance Use Topics   Alcohol use: Yes    Comment: occasionally   Drug use: No     Allergies   Sulfa antibiotics   Review of Systems Review of Systems   Constitutional:  Negative for fever.  HENT:  Positive for congestion.   Respiratory:  Positive for cough.   Neurological:  Positive for headaches.  All other systems reviewed and are negative.    Physical Exam Triage Vital Signs ED Triage Vitals  Encounter Vitals Group     BP      Systolic BP Percentile      Diastolic BP Percentile      Pulse      Resp      Temp      Temp src      SpO2      Weight      Height      Head Circumference      Peak Flow      Pain Score      Pain Loc      Pain Education      Exclude from Growth Chart    No data found.  Updated Vital Signs BP 124/84 (BP Location: Right Arm)   Pulse 95   Temp 99.4 F (37.4 C) (Oral)   Resp 16   Ht 5\' 8"  (1.727 m)   Wt 260 lb (117.9 kg)   SpO2 95%   BMI 39.53 kg/m   Visual Acuity Right Eye Distance:   Left Eye Distance:   Bilateral Distance:  Right Eye Near:   Left Eye Near:    Bilateral Near:     Physical Exam Vitals and nursing note reviewed.  Constitutional:      General: She is not in acute distress.    Appearance: She is well-developed and well-groomed.  HENT:     Head: Normocephalic and atraumatic.     Right Ear: Tympanic membrane is retracted.     Left Ear: Tympanic membrane is retracted.     Nose: Mucosal edema and congestion present.     Right Sinus: Maxillary sinus tenderness present. No frontal sinus tenderness.     Left Sinus: Maxillary sinus tenderness present. No frontal sinus tenderness.     Mouth/Throat:     Lips: Pink.     Mouth: Mucous membranes are moist.     Pharynx: Oropharynx is clear. Postnasal drip present.  Eyes:     General: Lids are normal.     Conjunctiva/sclera: Conjunctivae normal.     Pupils: Pupils are equal, round, and reactive to light.  Cardiovascular:     Rate and Rhythm: Normal rate and regular rhythm.     Pulses: Normal pulses.     Heart sounds: Normal heart sounds. No murmur heard. Pulmonary:     Effort: Pulmonary effort is normal. No  respiratory distress.     Breath sounds: Normal breath sounds and air entry.  Abdominal:     Palpations: Abdomen is soft.     Tenderness: There is no abdominal tenderness.  Musculoskeletal:        General: No swelling.     Cervical back: Normal range of motion and neck supple.  Skin:    General: Skin is warm and dry.     Capillary Refill: Capillary refill takes less than 2 seconds.  Neurological:     General: No focal deficit present.     Mental Status: She is alert and oriented to person, place, and time.     GCS: GCS eye subscore is 4. GCS verbal subscore is 5. GCS motor subscore is 6.  Psychiatric:        Attention and Perception: Attention normal.        Mood and Affect: Mood normal.        Speech: Speech normal.        Behavior: Behavior normal. Behavior is cooperative.     UC Treatments / Results  Labs (all labs ordered are listed, but only abnormal results are displayed) Labs Reviewed - No data to display  EKG   Radiology No results found.  Procedures Procedures (including critical care time)  Medications Ordered in UC Medications - No data to display  Initial Impression / Assessment and Plan / UC Course  I have reviewed the triage vital signs and the nursing notes.  Pertinent labs & imaging results that were available during my care of the patient were reviewed by me and considered in my medical decision making (see chart for details).     Ddx: Acute URI,allergies Final Clinical Impressions(s) / UC Diagnoses   Final diagnoses:  Acute upper respiratory infection     Discharge Instructions      Drink plenty of fluids. Rest, push fluids, take meds as directed. Avoid sun exposure as any antbiotics can make you more sensitive or cause upset stomach. Follow up with PCP. Return as needed.      ED Prescriptions     Medication Sig Dispense Auth. Provider   amoxicillin-clavulanate (AUGMENTIN) 875-125 MG tablet Take 1 tablet by mouth  every 12 (twelve)  hours. 14 tablet Victoriya Pol, Para March, NP      PDMP not reviewed this encounter.   Clancy Gourd, NP 03/24/23 [phone]

## 2023-12-30 ENCOUNTER — Ambulatory Visit: Admission: EM | Admit: 2023-12-30 | Discharge: 2023-12-30 | Disposition: A | Discharge status: Home / Self Care

## 2023-12-30 DIAGNOSIS — N3001 Acute cystitis with hematuria: Secondary | ICD-10-CM

## 2023-12-30 LAB — URINALYSIS, W/ REFLEX TO CULTURE (INFECTION SUSPECTED)
Bilirubin Urine: NEGATIVE
Glucose, UA: NEGATIVE mg/dL
Ketones, ur: NEGATIVE mg/dL
Nitrite: NEGATIVE
Protein, ur: 30 mg/dL — AB
RBC / HPF: >50 RBC/hpf (ref 0–5)
Specific Gravity, Urine: 1.02 (ref 1.005–1.030)
pH: 7 (ref 5.0–8.0)

## 2023-12-30 MED ORDER — PHENAZOPYRIDINE HCL 200 MG PO TABS
200.0000 mg | ORAL_TABLET | Freq: Three times a day (TID) | ORAL | 0 refills | Status: AC
Start: 2023-12-30 — End: ?

## 2023-12-30 MED ORDER — CEPHALEXIN 500 MG PO CAPS: 500.0000 mg | ORAL_CAPSULE | Freq: Three times a day (TID) | ORAL | 0 refills | Status: AC

## 2023-12-30 NOTE — Discharge Instructions (Signed)
  1. Acute cystitis with hematuria (Primary) - Urinalysis, w/ Reflex to Culture (Infection Suspected) completed in UC shows small leukocytes, large blood, no nitrite, many bacteria noted on micro, these findings are indicative of urinary tract infection. - Urine Culture collected and sent to lab for further testing results should be available in 2 to 3 days. - phenazopyridine (PYRIDIUM) 200 MG tablet; Take 1 tablet (200 mg total) by mouth 3 (three) times daily.  Dispense: 6 tablet; Refill: 0 - cephALEXin  (KEFLEX ) 500 MG capsule; Take 1 capsule (500 mg total) by mouth 3 (three) times daily for 7 days.  Dispense: 21 capsule; Refill: 0 -Continue to monitor symptoms for any change in severity if there is any escalation of current symptoms or development of new symptoms follow-up in ER for further evaluation and management.

## 2023-12-30 NOTE — ED Triage Notes (Signed)
 Pt c/o uti x1day  Pt states she is having urinary burning, lower abdominal pain, and urinary frequency starting this morning.

## 2023-12-30 NOTE — ED Provider Notes (Signed)
 [facility]  Note:  This document was prepared using Conservation officer, historic buildings and may include unintentional dictation errors.  MRN: [id number] DOB: 04-27-1986  Subjective:   Carrie Benjamin is a 38 y.o. female presenting for UTI-like symptoms with dysuria, suprapubic abdominal pressure and increased urinary frequency starting this morning.  Patient reports past history of urinary tract infections.  Patient denies any vaginal discharge, vaginal lesion, severe abdominal pain or flank pain.  Patient reports that previous urinary tract infections have presented with similar symptoms to current symptoms.  Patient denies any fever, shortness of breath, chest pain, weakness, dizziness.  No current facility-administered medications for this encounter.  Current Outpatient Medications:    cephALEXin  (KEFLEX ) 500 MG capsule, Take 1 capsule (500 mg total) by mouth 3 (three) times daily for 7 days., Disp: 21 capsule, Rfl: 0   gabapentin (NEURONTIN) 300 MG capsule, Take 300 mg by mouth 3 (three) times daily., Disp: , Rfl:    olmesartan (BENICAR) 20 MG tablet, Take by mouth., Disp: , Rfl:    phenazopyridine (PYRIDIUM) 200 MG tablet, Take 1 tablet (200 mg total) by mouth 3 (three) times daily., Disp: 6 tablet, Rfl: 0   DULoxetine (CYMBALTA) 20 MG capsule, Take by mouth., Disp: , Rfl:    Allergies  Allergen Reactions   Sulfa Antibiotics Rash    Past Medical History:  Diagnosis Date   Vertigo      Past Surgical History:  Procedure Laterality Date   FOOT SURGERY      Family History  Problem Relation Age of Onset   Healthy Mother    Healthy Father     Social History   Tobacco Use   Smoking status: Never   Smokeless tobacco: Never  Vaping Use   Vaping status: Never Used  Substance Use Topics   Alcohol use: Yes    Comment: occasionally   Drug use: No    ROS Refer to HPI for ROS details.  Objective:   Vitals: BP 112/81 (BP Location: Left Arm)    Pulse 91   Temp 98.7 F (37.1 C) (Oral)   Ht 5\' 8"  (1.727 m)   Wt 255 lb (115.7 kg)   LMP 12/16/2023   SpO2 98%   BMI 38.77 kg/m   Physical Exam Vitals and nursing note reviewed.  Constitutional:      General: She is not in acute distress.    Appearance: She is well-developed. She is not ill-appearing or toxic-appearing.  HENT:     Head: Normocephalic and atraumatic.  Cardiovascular:     Rate and Rhythm: Normal rate.  Pulmonary:     Effort: Pulmonary effort is normal. No respiratory distress.  Abdominal:     General: There is no distension.     Palpations: Abdomen is soft.     Tenderness: There is no abdominal tenderness. There is no right CVA tenderness, left CVA tenderness, guarding or rebound.  Skin:    General: Skin is warm and dry.  Neurological:     General: No focal deficit present.     Mental Status: She is alert and oriented to person, place, and time.  Psychiatric:        Mood and Affect: Mood normal.        Behavior: Behavior normal.     Procedures  Results for orders placed or performed during the hospital encounter of 12/30/23 (from the past 24 hours)  Urinalysis, w/ Reflex to Culture (Infection Suspected) -Urine, Clean Catch  Status: Abnormal   Collection Time: 12/30/23 12:45 PM  Result Value Ref Range   Specimen Source URINE, CLEAN CATCH    Color, Urine YELLOW YELLOW   APPearance HAZY (A) CLEAR   Specific Gravity, Urine 1.020 1.005 - 1.030   pH 7.0 5.0 - 8.0   Glucose, UA NEGATIVE NEGATIVE mg/dL   Hgb urine dipstick LARGE (A) NEGATIVE   Bilirubin Urine NEGATIVE NEGATIVE   Ketones, ur NEGATIVE NEGATIVE mg/dL   Protein, ur 30 (A) NEGATIVE mg/dL   Nitrite NEGATIVE NEGATIVE   Leukocytes,Ua SMALL (A) NEGATIVE   Squamous Epithelial / HPF 0-5 0 - 5 /HPF   WBC, UA 21-50 0 - 5 WBC/hpf   RBC / HPF >50 0 - 5 RBC/hpf   Bacteria, UA MANY (A) NONE SEEN   WBC Clumps PRESENT    Budding Yeast PRESENT     Assessment and Plan :     Discharge  Instructions       1. Acute cystitis with hematuria (Primary) - Urinalysis, w/ Reflex to Culture (Infection Suspected) completed in UC shows small leukocytes, large blood, no nitrite, many bacteria noted on micro, these findings are indicative of urinary tract infection. - Urine Culture collected and sent to lab for further testing results should be available in 2 to 3 days. - phenazopyridine (PYRIDIUM) 200 MG tablet; Take 1 tablet (200 mg total) by mouth 3 (three) times daily.  Dispense: 6 tablet; Refill: 0 - cephALEXin  (KEFLEX ) 500 MG capsule; Take 1 capsule (500 mg total) by mouth 3 (three) times daily for 7 days.  Dispense: 21 capsule; Refill: 0 -Continue to monitor symptoms for any change in severity if there is any escalation of current symptoms or development of new symptoms follow-up in ER for further evaluation and management.    [name]   Carrie Benjamin, [place], [place] 12/30/23 1331

## 2023-12-31 LAB — URINE CULTURE

## 2024-01-03 ENCOUNTER — Ambulatory Visit (HOSPITAL_COMMUNITY): Payer: Self-pay

## 2024-01-28 ENCOUNTER — Encounter: Payer: Self-pay | Admitting: Emergency Medicine

## 2024-01-28 ENCOUNTER — Ambulatory Visit
Admission: EM | Admit: 2024-01-28 | Discharge: 2024-01-28 | Disposition: A | Discharge status: Home / Self Care | Attending: Emergency Medicine | Admitting: Emergency Medicine

## 2024-01-28 DIAGNOSIS — H1032 Unspecified acute conjunctivitis, left eye: Secondary | ICD-10-CM | POA: Diagnosis not present

## 2024-01-28 DIAGNOSIS — Z973 Presence of spectacles and contact lenses: Secondary | ICD-10-CM | POA: Diagnosis not present

## 2024-01-28 MED ORDER — MOXIFLOXACIN HCL 0.5 % OP SOLN: OPHTHALMIC | 0 refills | Status: AC

## 2024-01-28 MED ORDER — FLUORESCEIN SODIUM 1 MG OP STRP
1.0000 | ORAL_STRIP | Freq: Once | OPHTHALMIC | Status: AC
  Administered 2024-01-28: 1 via OPHTHALMIC

## 2024-01-28 MED ORDER — TETRACAINE HCL 0.5 % OP SOLN
1.0000 [drp] | Freq: Once | OPHTHALMIC | Status: AC
  Administered 2024-01-28: 1 [drp] via OPHTHALMIC

## 2024-01-28 NOTE — ED Provider Notes (Signed)
 [facility]    CSN: [id number] Arrival date & time: 01/28/24  1217      History   Chief Complaint Chief Complaint  Patient presents with   Eye Problem    HPI Ercia Krissy Orebaugh is a 38 y.o. female.   38 year old female, Kimbree Casanas, presents to urgent care for evaluation of left eye issues/possible pinkeye, redness, watering, matting, symptoms started this am.  Patient has unknown exposure however was at pool recently and was around children.  Patient wears contacts, wearing glasses today, did not sleep in the contacts  The history is provided by the patient. No language interpreter was used.    Past Medical History:  Diagnosis Date   Vertigo     Patient Active Problem List   Diagnosis Date Noted   Acute bacterial conjunctivitis of left eye 01/28/2024   Uses contact lenses 01/28/2024   Acute upper respiratory infection 03/24/2023    Past Surgical History:  Procedure Laterality Date   FOOT SURGERY      OB History   No obstetric history on file.      Home Medications    Prior to Admission medications   Medication Sig Start Date End Date Taking? Authorizing Provider  gabapentin (NEURONTIN) 300 MG capsule Take 300 mg by mouth 3 (three) times daily.   Yes [provider]  moxifloxacin  (VIGAMOX ) 0.5 % ophthalmic solution Apply 1 drop left eye every 8 hours x 5 days 01/28/24  Yes Mercedees Convery, Eveleen Hinds, NP  olmesartan (BENICAR) 20 MG tablet Take by mouth. 04/15/21 01/28/24 Yes [provider]  DULoxetine (CYMBALTA) 20 MG capsule Take by mouth. 03/17/19 09/15/21  [provider]  phenazopyridine  (PYRIDIUM ) 200 MG tablet Take 1 tablet (200 mg total) by mouth 3 (three) times daily. 12/30/23   Reddick, Johnathan B, NP  fluticasone  (FLONASE ) 50 MCG/ACT nasal spray Place 1 spray into both nostrils daily. 09/27/19 04/10/20  Bynum Cassis, NP    Family History Family History  Problem Relation Age of Onset   Healthy Mother     Healthy Father     Social History Social History   Tobacco Use   Smoking status: Never   Smokeless tobacco: Never  Vaping Use   Vaping status: Never Used  Substance Use Topics   Alcohol use: Yes    Comment: occasionally   Drug use: No     Allergies   Sulfa antibiotics   Review of Systems Review of Systems  Eyes:  Positive for photophobia, discharge and redness. Negative for pain.  All other systems reviewed and are negative.    Physical Exam Triage Vital Signs ED Triage Vitals  Encounter Vitals Group     BP      Girls Systolic BP Percentile      Girls Diastolic BP Percentile      Boys Systolic BP Percentile      Boys Diastolic BP Percentile      Pulse      Resp      Temp      Temp src      SpO2      Weight      Height      Head Circumference      Peak Flow      Pain Score      Pain Loc      Pain Education      Exclude from Growth Chart    No data found.  Updated Vital Signs BP 130/85 (BP Location:  Left Arm)   Pulse (!) 107   Temp 98.6 F (37 C) (Oral)   Resp 16   Ht 5' 8 (1.727 m)   Wt 255 lb 1.2 oz (115.7 kg)   LMP 01/14/2024 (Approximate)   SpO2 95%   BMI 38.78 kg/m   Visual Acuity Right Eye Distance: 20/25 corrected Left Eye Distance: 20/25 corrected Bilateral Distance: 20/20 corrected  Right Eye Near:   Left Eye Near:    Bilateral Near:     Physical Exam Vitals and nursing note reviewed.  Constitutional:      General: She is not in acute distress.    Appearance: She is well-developed and well-groomed.  HENT:     Head: Normocephalic and atraumatic.   Eyes:     General: Lids are normal. Vision grossly intact.     Conjunctiva/sclera:     Right eye: Right conjunctiva is not injected. No exudate.    Left eye: Left conjunctiva is injected. Exudate present.     Pupils: Pupils are equal, round, and reactive to light.     Left eye: No fluorescein uptake.     Comments: Visual Acuity with glasses Right Eye Distance: 20/25  corrected Left Eye Distance: 20/25 corrected Bilateral Distance: 20/20 corrected   Cardiovascular:     Rate and Rhythm: Normal rate.     Heart sounds: No murmur heard. Pulmonary:     Effort: Pulmonary effort is normal. No respiratory distress.  Abdominal:     Palpations: Abdomen is soft.     Tenderness: There is no abdominal tenderness.   Musculoskeletal:        General: No swelling.     Cervical back: Neck supple.   Skin:    General: Skin is warm and dry.     Capillary Refill: Capillary refill takes less than 2 seconds.   Neurological:     General: No focal deficit present.     Mental Status: She is alert and oriented to person, place, and time.     GCS: GCS eye subscore is 4. GCS verbal subscore is 5. GCS motor subscore is 6.   Psychiatric:        Mood and Affect: Mood normal.        Behavior: Behavior is cooperative.      UC Treatments / Results  Labs (all labs ordered are listed, but only abnormal results are displayed) Labs Reviewed - No data to display  EKG   Radiology No results found.  Procedures Procedures (including critical care time)  Medications Ordered in UC Medications  fluorescein ophthalmic strip 1 strip (1 strip Left Eye Given 01/28/24 1232)  tetracaine (PONTOCAINE) 0.5 % ophthalmic solution 1 drop (1 drop Left Eye Given 01/28/24 1232)    Initial Impression / Assessment and Plan / UC Course  I have reviewed the triage vital signs and the nursing notes.  Pertinent labs & imaging results that were available during my care of the patient were reviewed by me and considered in my medical decision making (see chart for details).  Clinical Course as of 01/28/24 1502  Fri Jan 28, 2024  1220 Visual acuity, tetracaine, fluorescein ordered for left eye issues [JD]    Clinical Course User Index [JD] Ahri Olson, Eveleen Hinds, NP   Discussed exam findings and plan of care with patient, scripted Vigamox , strict go to ER precautions given.   Patient  verbalized understanding to this provider.  Ddx: Conjunctivitis, contact lens wearer, corneal abrasion Final Clinical Impressions(s) / UC Diagnoses  Final diagnoses:  Acute bacterial conjunctivitis of left eye  Uses contact lenses     Discharge Instructions      May use warm moist cotton balls wiping inner to outer eye to remove drainage from eyes,throw away,repeat. Wash hands before and after touching face,using eye medication. Do not rub eyes. Use eye medication as prescribed. Do not wear contacts until symptoms resolved. Follow up with PCP/eye doctor if no improvement or worsening of symptoms-call for appointment.  Go to ER if you have vision changes or loss of vision.  Return as needed     ED Prescriptions     Medication Sig Dispense Auth. Provider   moxifloxacin  (VIGAMOX ) 0.5 % ophthalmic solution Apply 1 drop left eye every 8 hours x 5 days 3 mL Tevita Gomer, Eveleen Hinds, NP      PDMP not reviewed this encounter.   Peter Brands, NP 01/28/24 [phone]

## 2024-01-28 NOTE — Discharge Instructions (Addendum)
 May use warm moist cotton balls wiping inner to outer eye to remove drainage from eyes,throw away,repeat. Wash hands before and after touching face,using eye medication. Do not rub eyes. Use eye medication as prescribed. Do not wear contacts until symptoms resolved. Follow up with PCP/eye doctor if no improvement or worsening of symptoms-call for appointment.  Go to ER if you have vision changes or loss of vision.  Return as needed

## 2024-01-28 NOTE — ED Triage Notes (Signed)
 Pt c/o left eye redness, matting, and watery. Started this morning. She states the right eye is starting to get red also.

## 2024-08-09 ENCOUNTER — Ambulatory Visit
Admission: EM | Admit: 2024-08-09 | Discharge: 2024-08-09 | Disposition: A | Discharge status: Home / Self Care | Source: Home / Self Care

## 2024-08-09 DIAGNOSIS — J0100 Acute maxillary sinusitis, unspecified: Secondary | ICD-10-CM

## 2024-08-09 MED ORDER — AMOXICILLIN-POT CLAVULANATE 875-125 MG PO TABS: 1.0000 | ORAL_TABLET | Freq: Two times a day (BID) | ORAL | 0 refills | Status: AC

## 2024-08-09 NOTE — ED Provider Notes (Signed)
 " [facility]    CSN: [id number] Arrival date & time: 08/09/24  1148      History   Chief Complaint Chief Complaint  Patient presents with   Sinus Problem   Cough    HPI Jaimey Joei Frangos is a 38 y.o. female  presents for evaluation of URI symptoms for 2 weeks. Patient reports associated symptoms of sinus pressure/pain with purulent nasal discharge, postnasal drip and cough with fatigue. Denies N/V/D, fever, sore throat, ear pain, body aches, shortness of breath. Patient does not have a hx of asthma. Patient is not an active smoker.   Reports sick contacts via daughter.  Pt has taken cold medicine OTC for symptoms. Pt has no other concerns at this time.    Sinus Problem  Cough   Past Medical History:  Diagnosis Date   Vertigo     Patient Active Problem List   Diagnosis Date Noted   Acute bacterial conjunctivitis of left eye 01/28/2024   Uses contact lenses 01/28/2024   Acute upper respiratory infection 03/24/2023   Essential hypertension 04/15/2021   OSA (obstructive sleep apnea) 01/31/2020   Right ovarian cyst 07/25/2015   Obesity (BMI 30-39.9) 07/23/2015    Past Surgical History:  Procedure Laterality Date   FOOT SURGERY      OB History   No obstetric history on file.      Home Medications    Prior to Admission medications  Medication Sig Start Date End Date Taking? Authorizing Provider  amoxicillin -clavulanate (AUGMENTIN ) 875-125 MG tablet Take 1 tablet by mouth every 12 (twelve) hours. 08/09/24  Yes Subrena Devereux, Jodi R, NP  DULoxetine (CYMBALTA) 20 MG capsule Take by mouth. 03/17/19 09/15/21  [provider]  gabapentin (NEURONTIN) 300 MG capsule Take 300 mg by mouth 3 (three) times daily.    [provider]  moxifloxacin  (VIGAMOX ) 0.5 % ophthalmic solution Apply 1 drop left eye every 8 hours x 5 days 01/28/24   Defelice, Rilla, NP  olmesartan (BENICAR) 20 MG tablet Take by mouth. 04/15/21 01/28/24  [provider]   phenazopyridine  (PYRIDIUM ) 200 MG tablet Take 1 tablet (200 mg total) by mouth 3 (three) times daily. 12/30/23   Reddick, Johnathan B, NP  fluticasone  (FLONASE ) 50 MCG/ACT nasal spray Place 1 spray into both nostrils daily. 09/27/19 04/10/20  Tellis Laneta NOVAK, NP    Family History Family History  Problem Relation Age of Onset   Healthy Mother    Healthy Father     Social History Social History[1]   Allergies   Sulfa antibiotics   Review of Systems Review of Systems  Constitutional:  Positive for fatigue.  HENT:  Positive for congestion, postnasal drip, sinus pressure and sinus pain.   Respiratory:  Positive for cough.      Physical Exam Triage Vital Signs ED Triage Vitals  Encounter Vitals Group     BP 08/09/24 1316 116/82     Girls Systolic BP Percentile --      Girls Diastolic BP Percentile --      Boys Systolic BP Percentile --      Boys Diastolic BP Percentile --      Pulse Rate 08/09/24 1316 88     Resp 08/09/24 1316 16     Temp 08/09/24 1316 98.7 F (37.1 C)     Temp Source 08/09/24 1316 Oral     SpO2 08/09/24 1316 98 %     Weight 08/09/24 1314 256 lb (116.1 kg)     Height --  Head Circumference --      Peak Flow --      Pain Score 08/09/24 1315 4     Pain Loc --      Pain Education --      Exclude from Growth Chart --    No data found.  Updated Vital Signs BP 116/82 (BP Location: Right Arm)   Pulse 88   Temp 98.7 F (37.1 C) (Oral)   Resp 16   Wt 256 lb (116.1 kg)   LMP 07/29/2024 (Approximate)   SpO2 98%   BMI 38.92 kg/m   Visual Acuity Right Eye Distance:   Left Eye Distance:   Bilateral Distance:    Right Eye Near:   Left Eye Near:    Bilateral Near:     Physical Exam Vitals and nursing note reviewed.  Constitutional:      General: She is not in acute distress.    Appearance: She is well-developed. She is not ill-appearing.  HENT:     Head: Normocephalic and atraumatic.     Right Ear: Tympanic membrane and ear canal normal.      Left Ear: Tympanic membrane and ear canal normal.     Nose: Congestion present.     Right Turbinates: Swollen and pale.     Left Turbinates: Swollen and pale.     Right Sinus: Maxillary sinus tenderness present.     Left Sinus: Maxillary sinus tenderness present.     Mouth/Throat:     Mouth: Mucous membranes are moist.     Pharynx: Oropharynx is clear. Uvula midline. No oropharyngeal exudate or posterior oropharyngeal erythema.     Tonsils: No tonsillar exudate or tonsillar abscesses.  Eyes:     Conjunctiva/sclera: Conjunctivae normal.     Pupils: Pupils are equal, round, and reactive to light.  Cardiovascular:     Rate and Rhythm: Normal rate and regular rhythm.     Heart sounds: Normal heart sounds.  Pulmonary:     Effort: Pulmonary effort is normal.     Breath sounds: Normal breath sounds.  Musculoskeletal:     Cervical back: Normal range of motion and neck supple.  Lymphadenopathy:     Cervical: No cervical adenopathy.  Skin:    General: Skin is warm and dry.  Neurological:     General: No focal deficit present.     Mental Status: She is alert and oriented to person, place, and time.  Psychiatric:        Mood and Affect: Mood normal.        Behavior: Behavior normal.      UC Treatments / Results  Labs (all labs ordered are listed, but only abnormal results are displayed) Labs Reviewed - No data to display  EKG   Radiology No results found.  Procedures Procedures (including critical care time)  Medications Ordered in UC Medications - No data to display  Initial Impression / Assessment and Plan / UC Course  I have reviewed the triage vital signs and the nursing notes.  Pertinent labs & imaging results that were available during my care of the patient were reviewed by me and considered in my medical decision making (see chart for details).     Start Augmentin  twice daily for 7 days given length of symptoms.  Patient has Flonase  at home and will use as  needed.  Nasal rinses as tolerated.  Advised PCP follow-up 2 to 3 days for recheck.  ER precautions reviewed. Final Clinical Impressions(s) / UC Diagnoses  Final diagnoses:  Acute maxillary sinusitis, recurrence not specified     Discharge Instructions      Start Augmentin  twice daily for 7 days.  Use your Flonase  once daily as well.  Nasal rinses such as Merrilyn Sieve as tolerated.  Lots of rest and fluids.  Please follow-up with your PCP in 2 to 3 days for recheck.  Please go to the ER for any worsening symptoms.  Hope you feel better soon!    ED Prescriptions     Medication Sig Dispense Auth. Provider   amoxicillin -clavulanate (AUGMENTIN ) 875-125 MG tablet Take 1 tablet by mouth every 12 (twelve) hours. 14 tablet Nashton Belson, Jodi R, NP      PDMP not reviewed this encounter.    [1]  Social History Tobacco Use   Smoking status: Never   Smokeless tobacco: Never  Vaping Use   Vaping status: Never Used  Substance Use Topics   Alcohol use: Yes    Comment: occasionally   Drug use: No     Loreda Myla SAUNDERS, NP 08/09/24 1342  "

## 2024-08-09 NOTE — Discharge Instructions (Addendum)
 Start Augmentin  twice daily for 7 days.  Use your Flonase  once daily as well.  Nasal rinses such as Merrilyn Sieve as tolerated.  Lots of rest and fluids.  Please follow-up with your PCP in 2 to 3 days for recheck.  Please go to the ER for any worsening symptoms.  Hope you feel better soon!

## 2024-08-09 NOTE — ED Triage Notes (Signed)
 Pt c/o cough,congestion & sinus pressure x2 wks. Has tried OTC meds w/o relief
